# Patient Record
Sex: Female | Born: 1965 | Hispanic: Yes | Smoking: Never smoker
Health system: Southern US, Community
[De-identification: ages and names within clinical notes are randomized; demographics above are authoritative.]

## PROBLEM LIST (undated history)

## (undated) DIAGNOSIS — K219 Gastro-esophageal reflux disease without esophagitis: Secondary | ICD-10-CM

## (undated) DIAGNOSIS — E785 Hyperlipidemia, unspecified: Secondary | ICD-10-CM

## (undated) DIAGNOSIS — I8390 Asymptomatic varicose veins of unspecified lower extremity: Secondary | ICD-10-CM

## (undated) DIAGNOSIS — Z87442 Personal history of urinary calculi: Secondary | ICD-10-CM

## (undated) DIAGNOSIS — Z972 Presence of dental prosthetic device (complete) (partial): Secondary | ICD-10-CM

## (undated) DIAGNOSIS — T753XXA Motion sickness, initial encounter: Secondary | ICD-10-CM

## (undated) DIAGNOSIS — N289 Disorder of kidney and ureter, unspecified: Secondary | ICD-10-CM

## (undated) DIAGNOSIS — K760 Fatty (change of) liver, not elsewhere classified: Secondary | ICD-10-CM

## (undated) DIAGNOSIS — G43909 Migraine, unspecified, not intractable, without status migrainosus: Secondary | ICD-10-CM

## (undated) HISTORY — DX: Hyperlipidemia, unspecified: E78.5

## (undated) HISTORY — PX: ABDOMINAL SURGERY: SHX537

---

## 2020-12-03 ENCOUNTER — Other Ambulatory Visit: Payer: Self-pay

## 2020-12-03 ENCOUNTER — Emergency Department: Payer: Self-pay

## 2020-12-03 ENCOUNTER — Emergency Department
Admission: EM | Admit: 2020-12-03 | Discharge: 2020-12-03 | Disposition: A | Discharge status: Home / Self Care | Payer: Self-pay | Attending: Emergency Medicine | Admitting: Emergency Medicine

## 2020-12-03 DIAGNOSIS — R202 Paresthesia of skin: Secondary | ICD-10-CM | POA: Insufficient documentation

## 2020-12-03 DIAGNOSIS — G43909 Migraine, unspecified, not intractable, without status migrainosus: Secondary | ICD-10-CM

## 2020-12-03 DIAGNOSIS — G43809 Other migraine, not intractable, without status migrainosus: Secondary | ICD-10-CM | POA: Insufficient documentation

## 2020-12-03 DIAGNOSIS — R109 Unspecified abdominal pain: Secondary | ICD-10-CM | POA: Insufficient documentation

## 2020-12-03 LAB — COMPREHENSIVE METABOLIC PANEL
ALT: 16 U/L (ref 0–44)
AST: 19 U/L (ref 15–41)
Albumin: 4.4 g/dL (ref 3.5–5.0)
Alkaline Phosphatase: 83 U/L (ref 38–126)
Anion gap: 9 (ref 5–15)
BUN: 17 mg/dL (ref 6–20)
CO2: 26 mmol/L (ref 22–32)
Calcium: 9.4 mg/dL (ref 8.9–10.3)
Chloride: 106 mmol/L (ref 98–111)
Creatinine, Ser: 0.55 mg/dL (ref 0.44–1.00)
GFR, Estimated: >60 mL/min (ref >60)
Glucose, Bld: 127 mg/dL — ABNORMAL HIGH (ref 70–99)
Potassium: 4.1 mmol/L (ref 3.5–5.1)
Sodium: 141 mmol/L (ref 135–145)
Total Bilirubin: 0.9 mg/dL (ref 0.3–1.2)
Total Protein: 7.4 g/dL (ref 6.5–8.1)

## 2020-12-03 LAB — DIFFERENTIAL
Abs Immature Granulocytes: 0.02 10*3/uL (ref 0.00–0.07)
Basophils Absolute: 0 10*3/uL (ref 0.0–0.1)
Basophils Relative: 1 %
Eosinophils Absolute: 0.1 10*3/uL (ref 0.0–0.5)
Eosinophils Relative: 3 %
Immature Granulocytes: 0 %
Lymphocytes Relative: 37 %
Lymphs Abs: 1.9 10*3/uL (ref 0.7–4.0)
Monocytes Absolute: 0.3 10*3/uL (ref 0.1–1.0)
Monocytes Relative: 6 %
Neutro Abs: 2.8 10*3/uL (ref 1.7–7.7)
Neutrophils Relative %: 53 %

## 2020-12-03 LAB — CBC
HCT: 44.4 % (ref 36.0–46.0)
Hemoglobin: 14.6 g/dL (ref 12.0–15.0)
MCH: 30.4 pg (ref 26.0–34.0)
MCHC: 32.9 g/dL (ref 30.0–36.0)
MCV: 92.5 fL (ref 80.0–100.0)
Platelets: 211 10*3/uL (ref 150–400)
RBC: 4.8 MIL/uL (ref 3.87–5.11)
RDW: 12.7 % (ref 11.5–15.5)
WBC: 5.1 10*3/uL (ref 4.0–10.5)
nRBC: 0 % (ref 0.0–0.2)

## 2020-12-03 LAB — APTT: aPTT: 29 seconds (ref 24–36)

## 2020-12-03 LAB — PROTIME-INR
INR: 1 (ref 0.8–1.2)
Prothrombin Time: 12.4 seconds (ref 11.4–15.2)

## 2020-12-03 MED ORDER — METOCLOPRAMIDE HCL 5 MG/ML IJ SOLN
10.0000 mg | Freq: Once | INTRAMUSCULAR | Status: AC
  Administered 2020-12-03: 10 mg via INTRAVENOUS
  Filled 2020-12-03: qty 2

## 2020-12-03 MED ORDER — KETOROLAC TROMETHAMINE 30 MG/ML IJ SOLN
30.0000 mg | Freq: Once | INTRAMUSCULAR | Status: AC
  Administered 2020-12-03: 30 mg via INTRAVENOUS
  Filled 2020-12-03: qty 1

## 2020-12-03 MED ORDER — BUTALBITAL-APAP-CAFFEINE 50-325-40 MG PO TABS: 1.0000 | ORAL_TABLET | Freq: Four times a day (QID) | ORAL | 0 refills | Status: DC | PRN

## 2020-12-03 MED ORDER — DIPHENHYDRAMINE HCL 50 MG/ML IJ SOLN
50.0000 mg | Freq: Once | INTRAMUSCULAR | Status: AC
  Administered 2020-12-03: 50 mg via INTRAVENOUS
  Filled 2020-12-03: qty 1

## 2020-12-03 MED ORDER — SODIUM CHLORIDE 0.9% FLUSH: 3.0000 mL | Freq: Once | INTRAVENOUS | Status: DC

## 2020-12-03 MED ORDER — SODIUM CHLORIDE 0.9 % IV BOLUS
1000.0000 mL | Freq: Once | INTRAVENOUS | Status: AC
  Administered 2020-12-03: 1000 mL via INTRAVENOUS

## 2020-12-03 NOTE — ED Notes (Signed)
Return from CT scan.  AAOx3  Skin warm and dry. NAD 

## 2020-12-03 NOTE — ED Triage Notes (Addendum)
Pt to ER via POV with complaints of headache that was previously diagnosed a complex migraine x3 months. Pain in head is accompanied with numbness and tingling down the right side into arm and leg/ blurred vision. Pt states "my vision is getting worse" This is on and off, when it stops the right leg is painful and the right arm feels like it is asleep. Has been told this could be sciatica.   Pt also reports a "pit" in her stomach. Pt is tearful in triage. States her husband passed away a month ago.

## 2020-12-03 NOTE — ED Provider Notes (Signed)
[facility] Emergency Department Provider Note  Time seen: 3:23 PM  I have reviewed the triage vital signs and the nursing notes.   HISTORY  Chief Complaint Headache and Abdominal Pain   HPI Brittney Hampton is a 55 y.o. female with no significant past medical history presents to the emergency department for a headache.  According to the patient for the past 3 months she has been experiencing intermittent headache however the past week or 2 she states it has been more constant.  States she will occasionally get tingling sensation in her hands and in her knees and sometimes weakness feeling in bilateral hands and knees.  Patient states that time she feels like her vision is blurred although denies any currently.  Patient does states she recently lost her husband as well as brother.  Denies any fever cough or shortness of breath.  Largely negative review of systems otherwise.   History reviewed. No pertinent past medical history.  There are no problems to display for this patient.   Past Surgical History:  Procedure Laterality Date  . ABDOMINAL SURGERY      Prior to Admission medications   Not on File    Not on File  No family history on file.  Social History Social History   Tobacco Use  . Smoking status: Never Smoker  . Smokeless tobacco: Never Used  Substance Use Topics  . Alcohol use: Never  . Drug use: Never    Review of Systems Constitutional: Negative for fever. Cardiovascular: Negative for chest pain. Respiratory: Negative for shortness of breath. Gastrointestinal: Negative for abdominal pain, vomiting and diarrhea. Musculoskeletal: Negative for musculoskeletal complaints Neurological: Negative for headache All other ROS negative  ____________________________________________   PHYSICAL EXAM:  VITAL SIGNS: ED Triage Vitals  Enc Vitals Group     BP 12/03/20 1307 (!) 141/82     Pulse Rate 12/03/20 1307 60     Resp 12/03/20 1307 16      Temp 12/03/20 1307 98.2 F (36.8 C)     Temp Source 12/03/20 1307 Oral     SpO2 12/03/20 1307 97 %     Weight 12/03/20 1308 161 lb (73 kg)     Height 12/03/20 1308 5\' 5"  (1.651 m)     Head Circumference --      Peak Flow --      Pain Score 12/03/20 1307 9     Pain Loc --      Pain Edu? --      Excl. in [place]? --    Constitutional: Alert and oriented. Well appearing and in no distress. Eyes: Normal exam ENT      Head: Normocephalic and atraumatic.      Mouth/Throat: Mucous membranes are moist. Cardiovascular: Normal rate, regular rhythm.  Respiratory: Normal respiratory effort without tachypnea nor retractions. Breath sounds are clear Gastrointestinal: Soft and nontender. No distention. Musculoskeletal: Nontender with normal range of motion in all extremities. Neurologic:  Normal speech and language. No gross focal neurologic deficits Skin:  Skin is warm, dry and intact.  Psychiatric: Mood and affect are normal.  ____________________________________________    EKG  EKG viewed and interpreted by myself shows a normal sinus rhythm at 6 6 bpm with a narrow QRS, normal axis, normal intervals, no concerning ST changes.  ____________________________________________    RADIOLOGY  IMPRESSION:  No acute intracranial abnormality by noncontrast CT.   ____________________________________________   INITIAL IMPRESSION / ASSESSMENT AND PLAN / ED COURSE  Pertinent labs & imaging  results that were available during my care of the patient were reviewed by me and considered in my medical decision making (see chart for details).   Patient presents emergency department for headache intermittent over the past 3 months although more constant over the past several weeks.  States she will intermittently get blurred vision, intermittently get tingling in her hands and in her knees.  Sometimes she will get pain down the right leg.  Patient does state that her husband as well as her brother  recently passed away.  Depression could be exacerbating the patient's symptoms.  Patient is labs today are largely within normal limits.  EKG and CT scan head are reassuring.  We will treat the patient's headache with fluids Toradol Reglan Benadryl and reassess.  Suspect possible migraine, complicated migraine, depression could be worsening symptoms.  Great neurological exam do not suspect CVA.  Patient's work-up is reassuring.  CT scan is negative.  Patient states her headache is gone after medications.  We will discharge with a prescription for Fioricet as well as neurology follow-up given the duration of the headache.  Patient agreeable to plan of care.  Brittney Hampton was evaluated in Emergency Department on 12/03/2020 for the symptoms described in the history of present illness. She was evaluated in the context of the global COVID-19 pandemic, which necessitated consideration that the patient might be at risk for infection with the SARS-CoV-2 virus that causes COVID-19. Institutional protocols and algorithms that pertain to the evaluation of patients at risk for COVID-19 are in a state of rapid change based on information released by regulatory bodies including the CDC and federal and state organizations. These policies and algorithms were followed during the patient's care in the ED.  ____________________________________________   FINAL CLINICAL IMPRESSION(S) / ED DIAGNOSES  Complicated migraine   Harvest Dark, MD 12/03/20 1658

## 2020-12-06 ENCOUNTER — Ambulatory Visit
Admission: EM | Admit: 2020-12-06 | Discharge: 2020-12-06 | Disposition: A | Discharge status: Home / Self Care | Payer: Self-pay | Attending: Internal Medicine | Admitting: Internal Medicine

## 2020-12-06 ENCOUNTER — Ambulatory Visit: Payer: Self-pay

## 2020-12-06 ENCOUNTER — Ambulatory Visit (INDEPENDENT_AMBULATORY_CARE_PROVIDER_SITE_OTHER): Payer: Self-pay

## 2020-12-06 ENCOUNTER — Encounter: Payer: Self-pay | Admitting: Emergency Medicine

## 2020-12-06 ENCOUNTER — Other Ambulatory Visit: Payer: Self-pay

## 2020-12-06 DIAGNOSIS — S1091XA Abrasion of unspecified part of neck, initial encounter: Secondary | ICD-10-CM

## 2020-12-06 DIAGNOSIS — S2002XA Contusion of left breast, initial encounter: Secondary | ICD-10-CM

## 2020-12-06 MED ORDER — TRAMADOL-ACETAMINOPHEN 37.5-325 MG PO TABS: 1.0000 | ORAL_TABLET | Freq: Four times a day (QID) | ORAL | 0 refills | Status: DC | PRN
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
## 2020-12-06 NOTE — Discharge Instructions (Addendum)
Aplique hielo a las areas dolorosas pr 15 minutes 3-5 veces al dia for 2 dias Vaya a ver a su doctora general en 2-3 dias.

## 2021-03-31 ENCOUNTER — Encounter: Payer: Self-pay | Admitting: Nurse Practitioner

## 2021-03-31 DIAGNOSIS — I700 Atherosclerosis of aorta: Secondary | ICD-10-CM | POA: Insufficient documentation

## 2021-04-01 ENCOUNTER — Other Ambulatory Visit: Payer: Self-pay

## 2021-04-01 ENCOUNTER — Ambulatory Visit (INDEPENDENT_AMBULATORY_CARE_PROVIDER_SITE_OTHER): Payer: Self-pay | Admitting: Nurse Practitioner

## 2021-04-01 ENCOUNTER — Encounter: Payer: Self-pay | Admitting: Nurse Practitioner

## 2021-04-01 VITALS — BP 106/70 | HR 73 | Temp 98.7°F | Wt 157.0 lb

## 2021-04-01 DIAGNOSIS — R7301 Impaired fasting glucose: Secondary | ICD-10-CM | POA: Insufficient documentation

## 2021-04-01 DIAGNOSIS — Z7689 Persons encountering health services in other specified circumstances: Secondary | ICD-10-CM

## 2021-04-01 DIAGNOSIS — R768 Other specified abnormal immunological findings in serum: Secondary | ICD-10-CM

## 2021-04-01 DIAGNOSIS — M2559 Pain in other specified joint: Secondary | ICD-10-CM

## 2021-04-01 DIAGNOSIS — G43709 Chronic migraine without aura, not intractable, without status migrainosus: Secondary | ICD-10-CM | POA: Insufficient documentation

## 2021-04-01 DIAGNOSIS — I700 Atherosclerosis of aorta: Secondary | ICD-10-CM

## 2021-04-01 DIAGNOSIS — M2550 Pain in unspecified joint: Secondary | ICD-10-CM | POA: Insufficient documentation

## 2021-04-01 DIAGNOSIS — Z599 Problem related to housing and economic circumstances, unspecified: Secondary | ICD-10-CM | POA: Insufficient documentation

## 2021-04-01 DIAGNOSIS — Z5989 Other problems related to housing and economic circumstances: Secondary | ICD-10-CM | POA: Insufficient documentation

## 2021-04-01 MED ORDER — GABAPENTIN 100 MG PO CAPS: 100.0000 mg | ORAL_CAPSULE | Freq: Every day | ORAL | 3 refills | Status: DC

## 2021-04-01 MED ORDER — RIZATRIPTAN BENZOATE 5 MG PO TABS: 5.0000 mg | ORAL_TABLET | ORAL | 0 refills | Status: DC | PRN

## 2021-04-01 NOTE — Assessment & Plan Note (Signed)
Ongoing for months to left side, recent imaging reassuring.  Suspect more migraine-related, have recommended she schedule with neurology, but reports she can not afford this.  Will place C4 referral for assist.  At this time trial Maxalt for acute migraine and Gabapentin 100 MG to start at night prior to bed for preventative -- this may offer benefit to arthralgias too.  Labs today.  Return in 2 weeks.

## 2021-04-01 NOTE — Assessment & Plan Note (Signed)
Referral to C4 team for assist, patient would benefit from neurology visit, but can not afford.

## 2021-04-01 NOTE — Assessment & Plan Note (Signed)
Noted on recent labs on review. Check A1c today.

## 2021-04-01 NOTE — Assessment & Plan Note (Signed)
C4 referral placed.

## 2021-04-01 NOTE — Assessment & Plan Note (Signed)
Ongoing to neck, hands, and knees for 3-4 months.  ?OA vs inflammatory disease such as RA or tick borne disease.  Will obtain labs today CBC, CMP, TSH, ANA, Lyme testing, CRP, ESR.  Determine next steps upon return of labs.  Trial Gabapentin 100 MG every night which may benefit migraines and pain.  Continue Tylenol as needed at home.  Return in 2 weeks.  Attempt to assist with getting into specialist if needed.

## 2021-04-01 NOTE — Patient Instructions (Signed)
Migraine Headache A migraine headache is a very strong throbbing pain on one side or both sides of your head. This type of headache can also cause other symptoms. It can last from 4 hours to 3 days. Talk with your doctor about what things may bring on (trigger) this condition. What are the causes? The exact cause of this condition is not known. This condition may be triggered or caused by: Drinking alcohol. Smoking. Taking medicines, such as: Medicine used to treat chest pain (nitroglycerin). Birth control pills. Estrogen. Some blood pressure medicines. Eating or drinking certain products. Doing physical activity. Other things that may trigger a migraine headache include: Having a menstrual period. Pregnancy. Hunger. Stress. Not getting enough sleep or getting too much sleep. Weather changes. Tiredness (fatigue). What increases the risk? Being 25-55 years old. Being female. Having a family history of migraine headaches. Being Caucasian. Having depression or anxiety. Being very overweight. What are the signs or symptoms? A throbbing pain. This pain may: Happen in any area of the head, such as on one side or both sides. Make it hard to do daily activities. Get worse with physical activity. Get worse around bright lights or loud noises. Other symptoms may include: Feeling sick to your stomach (nauseous). Vomiting. Dizziness. Being sensitive to bright lights, loud noises, or smells. Before you get a migraine headache, you may get warning signs (an aura). An aura may include: Seeing flashing lights or having blind spots. Seeing bright spots, halos, or zigzag lines. Having tunnel vision or blurred vision. Having numbness or a tingling feeling. Having trouble talking. Having weak muscles. Some people have symptoms after a migraine headache (postdromal phase), such as: Tiredness. Trouble thinking (concentrating). How is this treated? Taking medicines that: Relieve  pain. Relieve the feeling of being sick to your stomach. Prevent migraine headaches. Treatment may also include: Having acupuncture. Avoiding foods that bring on migraine headaches. Learning ways to control your body functions (biofeedback). Therapy to help you know and deal with negative thoughts (cognitive behavioral therapy). Follow these instructions at home: Medicines Take over-the-counter and prescription medicines only as told by your doctor. Ask your doctor if the medicine prescribed to you: Requires you to avoid driving or using heavy machinery. Can cause trouble pooping (constipation). You may need to take these steps to prevent or treat trouble pooping: Drink enough fluid to keep your pee (urine) pale yellow. Take over-the-counter or prescription medicines. Eat foods that are high in fiber. These include beans, whole grains, and fresh fruits and vegetables. Limit foods that are high in fat and sugar. These include fried or sweet foods. Lifestyle Do not drink alcohol. Do not use any products that contain nicotine or tobacco, such as cigarettes, e-cigarettes, and chewing tobacco. If you need help quitting, ask your doctor. Get at least 8 hours of sleep every night. Limit and deal with stress. General instructions   Keep a journal to find out what may bring on your migraine headaches. For example, write down: What you eat and drink. How much sleep you get. Any change in what you eat or drink. Any change in your medicines. If you have a migraine headache: Avoid things that make your symptoms worse, such as bright lights. It may help to lie down in a dark, quiet room. Do not drive or use heavy machinery. Ask your doctor what activities are safe for you. Keep all follow-up visits as told by your doctor. This is important. Contact a doctor if: You get a migraine headache that   is different or worse than others you have had. You have more than 15 headache days in one  month. Get help right away if: Your migraine headache gets very bad. Your migraine headache lasts longer than 72 hours. You have a fever. You have a stiff neck. You have trouble seeing. Your muscles feel weak or like you cannot control them. You start to lose your balance a lot. You start to have trouble walking. You pass out (faint). You have a seizure. Summary A migraine headache is a very strong throbbing pain on one side or both sides of your head. These headaches can also cause other symptoms. This condition may be treated with medicines and changes to your lifestyle. Keep a journal to find out what may bring on your migraine headaches. Contact a doctor if you get a migraine headache that is different or worse than others you have had. Contact your doctor if you have more than 15 headache days in a month. This information is not intended to replace advice given to you by your health care provider. Make sure you discuss any questions you have with your health care provider. Document Revised: 12/20/2018 Document Reviewed: 10/10/2018 Elsevier Patient Education  2022 [employer]  

## 2021-04-01 NOTE — Assessment & Plan Note (Signed)
Noted on recent imaging, plan on fasting lipid panel in future.  Monitor and if increased risk will recommend ASA and statin.

## 2021-04-01 NOTE — Progress Notes (Signed)
New Patient Office Visit  Subjective:  Patient ID: Brittney Hampton, female    DOB: February 20, 1966  Age: 55 y.o. MRN: [id number]  CC:  Chief Complaint  Patient presents with   Numbness    Patient states she is having issues with nerve pain. Patient states some times she experiencing numbness in her hands and feet. Patient states it feels like pins and needles sensation in her limbs. Patient states she will have pain in neck that radiates down to her right buttock. Patient describes the pain radiates down from her neck feels like a "heaviness sensation." Patient first notice numbness for about 4 months. Patient has OTC Advil.    Joint Swelling    Patient states she is here to discuss the swelling in her hands and she notices it more when she is working. Patient states especially in the morning and she can't make a fist that they are swollen so bad.     HPI Brittney Hampton presents for new patient visit to establish care.  Introduced to Designer, jewellery role and practice setting.  All questions answered.  Discussed provider/patient relationship and expectations.  Spanish speaking only -- interpreter at bedside.    PERSISTENT HEADACHES Seen in ER 12/03/20 and 02/17/21 for ongoing headache complaints. Has some dizziness and nausea -- headaches for 5 months, comes and goes.  She reports medicine provided in ER helped.  She has referral to neurology, but too expensive to see them.  Recent imaging did note aortic atherosclerosis. Duration: months Onset: gradual Severity: 5/10 Quality: dull, aching, and throbbing Frequency: intermittent Location: to right side of head Headache duration: 3 hours to 24 hours or more Radiation: no Time of day headache occurs: varies Alleviating factors: none Aggravating factors:  Headache status at time of visit: asymptomatic Treatments attempted: none   Aura: no Nausea:  yes Vomiting: no Photophobia:  no Phonophobia:  no Effect on social functioning:   yes Numbers of missed days of school/work each month: yes Confusion:  no Gait disturbance/ataxia:  no Behavioral changes:  no Fevers:  no   JOINT PAIN (Neck, knees, hands) Ongoing for 3-4 months, became worse one month ago.  Pain is causing her problems at work, working in Psychologist, sport and exercise in Surveyor, quantity area.  Using hands a lot + standing for long hours.  Does not recall tick bite recently or in past, but does have lots of ticks around where she lives. Status: uncontrolled Treatments attempted: APAP and ibuprofen  Relief with NSAIDs?:  moderate -- at the moment, but after medication wears off pain returns Location: neck, knees, hands Duration:months Severity: 5/10 Quality: no description -- pain in neck -- knee pins and needles Frequency: intermittent -- notices it most when gets up in morning, lasting 3-4 hours, different every morning Radiation: none Aggravating factors: nothing Alleviating factors: NSAIDs and APAP Weakness:  yes in hands Paresthesias / decreased sensation:  yes in hands Fevers:  no   History reviewed. No pertinent past medical history.  Past Surgical History:  Procedure Laterality Date   ABDOMINAL SURGERY     CESAREAN SECTION      Family History  Problem Relation Age of Onset   Osteoarthritis Mother    Diabetes Father    Cancer Brother     Social History   Socioeconomic History   Marital status: Married    Spouse name: Not on file   Number of children: Not on file   Years of education: Not on file   Highest education level: Not on  file  Occupational History   Not on file  Tobacco Use   Smoking status: Never   Smokeless tobacco: Never  Vaping Use   Vaping Use: Never used  Substance and Sexual Activity   Alcohol use: Never   Drug use: Never   Sexual activity: Yes  Other Topics Concern   Not on file  Social History Narrative   ** Merged History Encounter **       Social Determinants of Health   Financial Resource Strain: Medium Risk    Difficulty of Paying Living Expenses: Somewhat hard  Food Insecurity: No Food Insecurity   Worried About Charity fundraiser in the Last Year: Never true   Ran Out of Food in the Last Year: Never true  Transportation Needs: No Transportation Needs   Lack of Transportation (Medical): No   Lack of Transportation (Non-Medical): No  Physical Activity: Insufficiently Active   Days of Exercise per Week: 2 days   Minutes of Exercise per Session: 20 min  Stress: Stress Concern Present   Feeling of Stress : To some extent  Social Connections: Moderately Isolated   Frequency of Communication with Friends and Family: More than three times a week   Frequency of Social Gatherings with Friends and Family: More than three times a week   Attends Religious Services: Never   Marine scientist or Organizations: No   Attends Music therapist: Never   Marital Status: Married  Human resources officer Violence: Not At Risk   Fear of Current or Ex-Partner: No   Emotionally Abused: No   Physically Abused: No   Sexually Abused: No    ROS Review of Systems  Constitutional:  Negative for activity change, appetite change, diaphoresis, fatigue and fever.  Respiratory:  Negative for cough, chest tightness and shortness of breath.   Cardiovascular:  Negative for chest pain, palpitations and leg swelling.  Gastrointestinal: Negative.   Endocrine: Negative for cold intolerance, heat intolerance, polydipsia, polyphagia and polyuria.  Musculoskeletal:  Positive for arthralgias, joint swelling and neck pain. Negative for neck stiffness.  Neurological:  Positive for weakness and headaches. Negative for dizziness, syncope, light-headedness and numbness.  Psychiatric/Behavioral: Negative.     Objective:   Today's Vitals: BP 106/70   Pulse 73   Temp 98.7 F (37.1 C) (Oral)   Wt 157 lb (71.2 kg)   LMP 12/06/2016 (Approximate)   SpO2 98%   BMI 26.13 kg/m   Physical Exam Vitals and nursing note  reviewed.  Constitutional:      General: She is awake. She is not in acute distress.    Appearance: She is well-developed and well-groomed. She is not ill-appearing or toxic-appearing.  HENT:     Head: Normocephalic.     Right Ear: Hearing normal.     Left Ear: Hearing normal.  Eyes:     General: Lids are normal.        Right eye: No discharge.        Left eye: No discharge.     Conjunctiva/sclera: Conjunctivae normal.     Pupils: Pupils are equal, round, and reactive to light.  Neck:     Thyroid: No thyromegaly.     Vascular: No carotid bruit.  Cardiovascular:     Rate and Rhythm: Normal rate and regular rhythm.     Heart sounds: Normal heart sounds. No murmur heard.   No gallop.  Pulmonary:     Effort: Pulmonary effort is normal. No accessory muscle usage or respiratory  distress.     Breath sounds: Normal breath sounds.  Abdominal:     General: Bowel sounds are normal. There is no distension.     Palpations: Abdomen is soft.  Musculoskeletal:     Right hand: No swelling or tenderness. Normal range of motion. Decreased strength of thumb/finger opposition. Normal sensation.     Left hand: No swelling or tenderness. Normal range of motion. Decreased strength of thumb/finger opposition. Normal sensation.     Cervical back: Normal range of motion and neck supple. No rigidity or crepitus. No muscular tenderness. Normal range of motion.     Right knee: Crepitus present. No swelling, erythema, lacerations or bony tenderness. Normal range of motion. Tenderness present over the patellar tendon.     Instability Tests: Anterior drawer test negative. Posterior drawer test negative.     Left knee: Crepitus present. No swelling, erythema, lacerations or bony tenderness. Normal range of motion. Tenderness present over the patellar tendon.     Instability Tests: Anterior drawer test negative. Posterior drawer test negative.     Right lower leg: No edema.     Left lower leg: No edema.   Lymphadenopathy:     Cervical: No cervical adenopathy.  Skin:    General: Skin is warm and dry.  Neurological:     Mental Status: She is alert and oriented to person, place, and time.     Cranial Nerves: Cranial nerves are intact.     Sensory: Sensation is intact.     Motor: Motor function is intact.     Coordination: Coordination is intact.     Gait: Gait is intact.     Deep Tendon Reflexes: Reflexes are normal and symmetric.     Reflex Scores:      Brachioradialis reflexes are 2+ on the right side and 2+ on the left side.      Patellar reflexes are 2+ on the right side and 2+ on the left side. Psychiatric:        Attention and Perception: Attention normal.        Mood and Affect: Mood normal.        Behavior: Behavior is cooperative.        Thought Content: Thought content normal.        Judgment: Judgment normal.    Assessment & Plan:   Problem List Items Addressed This Visit       Cardiovascular and Mediastinum   Aortic atherosclerosis ([place])    Noted on recent imaging, plan on fasting lipid panel in future.  Monitor and if increased risk will recommend ASA and statin.       Chronic migraine without aura without status migrainosus, not intractable    Ongoing for months to left side, recent imaging reassuring.  Suspect more migraine-related, have recommended she schedule with neurology, but reports she can not afford this.  Will place C4 referral for assist.  At this time trial Maxalt for acute migraine and Gabapentin 100 MG to start at night prior to bed for preventative -- this may offer benefit to arthralgias too.  Labs today.  Return in 2 weeks.       Relevant Medications   rizatriptan (MAXALT) 5 MG tablet   gabapentin (NEURONTIN) 100 MG capsule   Other Relevant Orders   Magnesium     Endocrine   IFG (impaired fasting glucose)    Noted on recent labs on review. Check A1c today.       Relevant Orders   HgB  A1c     Other   Uninsured    Referral to C4 team  for assist, patient would benefit from neurology visit, but can not afford.       Joint pain    Ongoing to neck, hands, and knees for 3-4 months.  ?OA vs inflammatory disease such as RA or tick borne disease.  Will obtain labs today CBC, CMP, TSH, ANA, Lyme testing, CRP, ESR.  Determine next steps upon return of labs.  Trial Gabapentin 100 MG every night which may benefit migraines and pain.  Continue Tylenol as needed at home.  Return in 2 weeks.  Attempt to assist with getting into specialist if needed.       Relevant Orders   ANA w/Reflex if Positive   C-reactive protein   Sed Rate (ESR)   Magnesium   B12   CBC with Differential/Platelet   Comprehensive metabolic panel   TSH   Lyme Disease Serology w/Reflex   Financial difficulties    C4 referral placed.       Relevant Orders   AMB Referral to [facility]   Other Visit Diagnoses     Encounter to establish care    -  Primary       Outpatient Encounter Medications as of 04/01/2021  Medication Sig   gabapentin (NEURONTIN) 100 MG capsule Take 1 capsule (100 mg total) by mouth at bedtime.   rizatriptan (MAXALT) 5 MG tablet Take 1 tablet (5 mg total) by mouth as needed for migraine. May repeat in 2 hours if needed   [DISCONTINUED] butalbital-acetaminophen-caffeine (FIORICET) 50-325-40 MG tablet Take 1-2 tablets by mouth every 6 (six) hours as needed for headache. (Patient not taking: Reported on 04/01/2021)   [DISCONTINUED] traMADol-acetaminophen (ULTRACET) 37.5-325 MG tablet Take 1 tablet by mouth every 6 (six) hours as needed. (Patient not taking: Reported on 04/01/2021)   No facility-administered encounter medications on file as of 04/01/2021.    Follow-up: Return in about 2 weeks (around 04/15/2021) for Migraines and joint pain.   Venita Lick, NP

## 2021-04-02 ENCOUNTER — Encounter: Payer: Self-pay | Admitting: Nurse Practitioner

## 2021-04-02 DIAGNOSIS — E538 Deficiency of other specified B group vitamins: Secondary | ICD-10-CM | POA: Insufficient documentation

## 2021-04-04 ENCOUNTER — Telehealth: Payer: Self-pay | Admitting: Nurse Practitioner

## 2021-04-04 ENCOUNTER — Telehealth: Payer: Self-pay | Admitting: *Deleted

## 2021-04-04 DIAGNOSIS — R768 Other specified abnormal immunological findings in serum: Secondary | ICD-10-CM | POA: Insufficient documentation

## 2021-04-04 LAB — COMPREHENSIVE METABOLIC PANEL
ALT: 17 IU/L (ref 0–32)
AST: 18 IU/L (ref 0–40)
Albumin/Globulin Ratio: 1.8 (ref 1.2–2.2)
Albumin: 4.4 g/dL (ref 3.8–4.9)
Alkaline Phosphatase: 95 IU/L (ref 44–121)
BUN/Creatinine Ratio: 18 (ref 9–23)
BUN: 13 mg/dL (ref 6–24)
Bilirubin Total: 0.7 mg/dL (ref 0.0–1.2)
CO2: 24 mmol/L (ref 20–29)
Calcium: 9.8 mg/dL (ref 8.7–10.2)
Chloride: 101 mmol/L (ref 96–106)
Creatinine, Ser: 0.72 mg/dL (ref 0.57–1.00)
Globulin, Total: 2.5 g/dL (ref 1.5–4.5)
Glucose: 81 mg/dL (ref 65–99)
Potassium: 4.9 mmol/L (ref 3.5–5.2)
Sodium: 141 mmol/L (ref 134–144)
Total Protein: 6.9 g/dL (ref 6.0–8.5)
eGFR: 100 mL/min/{1.73_m2} (ref >59)

## 2021-04-04 LAB — MAGNESIUM: Magnesium: 2.2 mg/dL (ref 1.6–2.3)

## 2021-04-04 LAB — CBC WITH DIFFERENTIAL/PLATELET
Basophils Absolute: 0 10*3/uL (ref 0.0–0.2)
Basos: 1 %
EOS (ABSOLUTE): 0.1 10*3/uL (ref 0.0–0.4)
Eos: 3 %
Hematocrit: 45.9 % (ref 34.0–46.6)
Hemoglobin: 14.9 g/dL (ref 11.1–15.9)
Immature Grans (Abs): 0 10*3/uL (ref 0.0–0.1)
Immature Granulocytes: 0 %
Lymphocytes Absolute: 1.9 10*3/uL (ref 0.7–3.1)
Lymphs: 41 %
MCH: 29.7 pg (ref 26.6–33.0)
MCHC: 32.5 g/dL (ref 31.5–35.7)
MCV: 91 fL (ref 79–97)
Monocytes Absolute: 0.4 10*3/uL (ref 0.1–0.9)
Monocytes: 8 %
Neutrophils Absolute: 2.2 10*3/uL (ref 1.4–7.0)
Neutrophils: 47 %
Platelets: 241 10*3/uL (ref 150–450)
RBC: 5.02 x10E6/uL (ref 3.77–5.28)
RDW: 12.6 % (ref 11.7–15.4)
WBC: 4.7 10*3/uL (ref 3.4–10.8)

## 2021-04-04 LAB — ANA W/REFLEX IF POSITIVE
Anti JO-1: <0.2 AI (ref 0.0–0.9)
Anti Nuclear Antibody (ANA): POSITIVE — AB
Centromere Ab Screen: <0.2 AI (ref 0.0–0.9)
Chromatin Ab SerPl-aCnc: <0.2 AI (ref 0.0–0.9)
ENA RNP Ab: 1.3 AI — ABNORMAL HIGH (ref 0.0–0.9)
ENA SM Ab Ser-aCnc: <0.2 AI (ref 0.0–0.9)
ENA SSA (RO) Ab: <0.2 AI (ref 0.0–0.9)
ENA SSB (LA) Ab: <0.2 AI (ref 0.0–0.9)
Scleroderma (Scl-70) (ENA) Antibody, IgG: <0.2 AI (ref 0.0–0.9)
dsDNA Ab: 2 IU/mL (ref 0–9)

## 2021-04-04 LAB — LYME DISEASE SEROLOGY W/REFLEX: Lyme Total Antibody EIA: NEGATIVE

## 2021-04-04 LAB — HEMOGLOBIN A1C
Est. average glucose Bld gHb Est-mCnc: 114 mg/dL
Hgb A1c MFr Bld: 5.6 % (ref 4.8–5.6)

## 2021-04-04 LAB — C-REACTIVE PROTEIN: CRP: 2 mg/L (ref 0–10)

## 2021-04-04 LAB — VITAMIN B12: Vitamin B-12: 142 pg/mL — ABNORMAL LOW (ref 232–1245)

## 2021-04-04 LAB — SEDIMENTATION RATE: Sed Rate: 23 mm/hr (ref 0–40)

## 2021-04-04 LAB — TSH: TSH: 1.92 u[IU]/mL (ref 0.450–4.500)

## 2021-04-04 NOTE — Telephone Encounter (Signed)
Pt called stating that at her last appt she discussed with PCP her feeling of dizziness. She states that she usually feels this in the mornings and was advised that she would receive a medication to help. She is requesting to have PCP look into this for her. Pt is not currently experiencing this on the phone. Please advise.       [facility] [employer], [place] [address] AT [address]  [place] [address]  Phone: [phone] Fax: [phone]  Hours: Not open 24 hours

## 2021-04-04 NOTE — Addendum Note (Signed)
Addended by: Marnee Guarneri T on: 04/04/2021 05:08 PM   Modules accepted: Orders

## 2021-04-04 NOTE — Progress Notes (Signed)
Good afternoon, patient is Spanish speaking only.  Please let her know labs have returned: - B12 level is very low.  I want her to start taking Vitamin B12 1000 MCG daily which she can get over the counter -- this is good for nervous system health and nerve related pain.   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  This is connected with mixed connective tissue disease, systemic lupus, or different muscle inflammation disorders.  She will need to see rheumatology and I will place this referral as this is their specialty and she will need further work-up on how to best and appropriately help her current pain.   - All other labs are stable and normal.  She should hear from rheumatology to schedule over next week, if she does not let us know right away. Keep being awesome!!  Thank you for allowing me to participate in your care.  I appreciate you. Kindest regards, Yichen Gilardi

## 2021-04-04 NOTE — Addendum Note (Signed)
Addended by: Marnee Guarneri T on: 04/04/2021 01:07 PM   Modules accepted: Orders

## 2021-04-05 ENCOUNTER — Telehealth: Payer: Self-pay

## 2021-04-05 ENCOUNTER — Telehealth: Payer: Self-pay | Admitting: *Deleted

## 2021-04-05 LAB — SPECIMEN STATUS REPORT

## 2021-04-05 LAB — URIC ACID: Uric Acid: 3.9 mg/dL (ref 3.0–7.2)

## 2021-04-05 MED ORDER — MECLIZINE HCL 12.5 MG PO TABS: 12.5000 mg | ORAL_TABLET | Freq: Three times a day (TID) | ORAL | 0 refills | Status: DC | PRN

## 2021-04-05 NOTE — Addendum Note (Signed)
Addended by: Marnee Guarneri T on: 04/05/2021 08:46 AM   Modules accepted: Orders

## 2021-04-05 NOTE — Telephone Encounter (Signed)
Attempted to call via Interpreter line, no answer left VM to call office back. OK for PEC to give patient results if calls back.

## 2021-04-05 NOTE — Telephone Encounter (Signed)
   Telephone encounter was:  Unsuccessful.  04/05/2021 Name: Mikali Willhoite MRN: [id number] DOB: 1966/08/28  Unsuccessful outbound call made today to assist with:   insurance   Outreach Attempt:  2nd Attempt  A HIPAA compliant voice message was left requesting a return call.  Instructed patient to call back at   Instructed patient to call back at [phone]  at their earliest convenience. . [place], Care Management  [phone] [address] , [place] [address] Email : [email or web address]

## 2021-04-05 NOTE — Telephone Encounter (Signed)
Copied from [place] [id number]. Topic: General - Other >> Apr 04, 2021  3:45 PM Celene Kras wrote: Reason for CRM: Pt called stating that she is needing to have her lab results and is requesting to have someone give her a call back to discuss. Please advise.

## 2021-04-05 NOTE — Telephone Encounter (Signed)
Please see other telephone encounter and lab result.

## 2021-04-07 ENCOUNTER — Telehealth: Payer: Self-pay | Admitting: *Deleted

## 2021-04-07 NOTE — Telephone Encounter (Signed)
   Telephone encounter was:  Unsuccessful.  04/07/2021 Name: Brittney Hampton MRN: [id number] DOB: September 29, 1965  Unsuccessful outbound call made today to assist with:  Transportation Needs  and Food Insecurity  Outreach Attempt:  2nd Attempt  [place], Care Management  [phone] [address] , [place] [address] Email : [email or web address]

## 2021-04-11 ENCOUNTER — Telehealth: Payer: Self-pay | Admitting: *Deleted

## 2021-04-11 NOTE — Telephone Encounter (Signed)
   Telephone encounter was:  Unsuccessful.  04/11/2021 Name: Brittney Hampton MRN: [id number] DOB: 06/09/1966  Unsuccessful outbound call made today to assist with:  Patient does not respond to voicemail's left from pacific interpreter   Outreach Attempt:  3rd Attempt.  Referral closed unable to contact patient.  A HIPAA compliant voice message was left requesting a return call.  Instructed patient to call back at   Instructed patient to call back at [phone]  at their earliest convenience. . [place], Care Management  [phone] [address] , [place] [address] Email : [email or web address]

## 2021-04-14 ENCOUNTER — Other Ambulatory Visit: Payer: Self-pay

## 2021-04-14 ENCOUNTER — Ambulatory Visit (INDEPENDENT_AMBULATORY_CARE_PROVIDER_SITE_OTHER): Payer: Self-pay | Admitting: Nurse Practitioner

## 2021-04-14 ENCOUNTER — Encounter: Payer: Self-pay | Admitting: Nurse Practitioner

## 2021-04-14 ENCOUNTER — Telehealth: Payer: Self-pay | Admitting: Nurse Practitioner

## 2021-04-14 VITALS — BP 105/63 | HR 61 | Temp 98.2°F | Wt 167.6 lb

## 2021-04-14 DIAGNOSIS — K769 Liver disease, unspecified: Secondary | ICD-10-CM

## 2021-04-14 DIAGNOSIS — G43709 Chronic migraine without aura, not intractable, without status migrainosus: Secondary | ICD-10-CM

## 2021-04-14 DIAGNOSIS — E538 Deficiency of other specified B group vitamins: Secondary | ICD-10-CM

## 2021-04-14 DIAGNOSIS — M2559 Pain in other specified joint: Secondary | ICD-10-CM

## 2021-04-14 MED ORDER — AMITRIPTYLINE HCL 25 MG PO TABS: 25.0000 mg | ORAL_TABLET | Freq: Every day | ORAL | 1 refills | Status: DC

## 2021-04-14 NOTE — Telephone Encounter (Deleted)
Pts son is calling bc pt urology and

## 2021-04-14 NOTE — Assessment & Plan Note (Signed)
Discussed her lab results from 2  Weeks ago. Vitamin B12 142 on 04/01/21. She will start taking vitamin B12 supplement OTC daily. This may be causing some of the numbness and tingling in her arms and legs.

## 2021-04-14 NOTE — Assessment & Plan Note (Signed)
Gabapentin and maxalt not helping headaches. Will start amitriptyline at bedtime. This can help prevent headaches and help her sleep. Discussed possible side effects, call immediately if develop SI/HI. Follow-up in 4 weeks.

## 2021-04-14 NOTE — Assessment & Plan Note (Signed)
ANA and ENA RNP antibody positive on blood work. Rheumatology referral placed last week and is in progress. She can take ibuprofen or tylenol to help with pain. Will await recommendations from rheumatology. Note given for work that allows her to take an extra break in the morning and afternoon. Follow up in 4 weeks.

## 2021-04-14 NOTE — Patient Instructions (Addendum)
Start vitamin B12 over the counter 1,05mg daily  DR. PQuintin Altoat [facility] HospitalRheumatology:  [facility]1[place] [phone]

## 2021-04-14 NOTE — Progress Notes (Signed)
Established Patient Office Visit  Subjective:  Patient ID: Brittney Hampton, female    DOB: 1966/06/30  Age: 55 y.o. MRN: [id number]  CC:  Chief Complaint  Patient presents with   Migraine    2 week f/up- pt states that the medication is not really helping her. States the pain is still coming and going.    Pain    Pt states she doesn't know if she can return to work full duty, wants to discuss going back with restrictions     HPI Coda Filler presents for follow up on migraines and joint pain.  MIGRAINES  She was started on maxalt and gabapentin last visit to help with migraines. She states that neither of the medications helped at all. She is still having a daily headache. She only takes ibuprofen once in a while because tylenol and ibuprofen upset her stomach. She says that the headaches and joint pain cause her to have trouble sleeping. The headaches tend to be on the right side of her head, sometimes in the front on her forehead. The migraines started 2 years ago. She has been under some stress recently, she moved to the [place] 6 months ago and had to get a job.   JOINT PAIN  She notes pain in her hands, back, and knees. She states that her hands are swollen in the mornings and she has a hard time closing her fists. The pain is worse while laying down at night and first thing in the morning. The gabapentin has not helped at all. She has not started taking the vitamin B12 OTC supplement. She is having a hard time at work due to the pain. The first day at work is fine, but then later in the week she is having a hard time working due to pain, which is a numbness and tingling feeling in her hands and right arm.   History reviewed. No pertinent past medical history.  Past Surgical History:  Procedure Laterality Date   ABDOMINAL SURGERY     CESAREAN SECTION      Family History  Problem Relation Age of Onset   Osteoarthritis Mother    Diabetes Father    Cancer Brother     Social  History   Socioeconomic History   Marital status: Married    Spouse name: Not on file   Number of children: Not on file   Years of education: Not on file   Highest education level: Not on file  Occupational History   Not on file  Tobacco Use   Smoking status: Never   Smokeless tobacco: Never  Vaping Use   Vaping Use: Never used  Substance and Sexual Activity   Alcohol use: Never   Drug use: Never   Sexual activity: Yes  Other Topics Concern   Not on file  Social History Narrative   ** Merged History Encounter **       Social Determinants of Health   Financial Resource Strain: Medium Risk   Difficulty of Paying Living Expenses: Somewhat hard  Food Insecurity: No Food Insecurity   Worried About Charity fundraiser in the Last Year: Never true   [place] in the Last Year: Never true  Transportation Needs: No Transportation Needs   Lack of Transportation (Medical): No   Lack of Transportation (Non-Medical): No  Physical Activity: Insufficiently Active   Days of Exercise per Week: 2 days   Minutes of Exercise per Session: 20 min  Stress: Stress  Concern Present   Feeling of Stress : To some extent  Social Connections: Moderately Isolated   Frequency of Communication with Friends and Family: More than three times a week   Frequency of Social Gatherings with Friends and Family: More than three times a week   Attends Religious Services: Never   Marine scientist or Organizations: No   Attends Music therapist: Never   Marital Status: Married  Human resources officer Violence: Not At Risk   Fear of Current or Ex-Partner: No   Emotionally Abused: No   Physically Abused: No   Sexually Abused: No    Outpatient Medications Prior to Visit  Medication Sig Dispense Refill   cephALEXin (KEFLEX) 500 MG capsule Take 500 mg by mouth in the morning and at bedtime.     gabapentin (NEURONTIN) 100 MG capsule Take 1 capsule (100 mg total) by mouth at bedtime. 90  capsule 3   HYDROcodone-acetaminophen (NORCO/VICODIN) 5-325 MG tablet Take by mouth.     ketorolac (TORADOL) 10 MG tablet Take by mouth.     meclizine (ANTIVERT) 12.5 MG tablet Take 1 tablet (12.5 mg total) by mouth 3 (three) times daily as needed for dizziness. 30 tablet 0   ondansetron (ZOFRAN-ODT) 4 MG disintegrating tablet Take by mouth.     rizatriptan (MAXALT) 5 MG tablet Take 1 tablet (5 mg total) by mouth as needed for migraine. May repeat in 2 hours if needed 10 tablet 0   tamsulosin (FLOMAX) 0.4 MG CAPS capsule Take by mouth.     No facility-administered medications prior to visit.    No Known Allergies  ROS Review of Systems  Constitutional:  Positive for fatigue.  HENT: Negative.    Respiratory: Negative.    Cardiovascular: Negative.   Gastrointestinal: Negative.   Genitourinary: Negative.   Musculoskeletal:  Positive for arthralgias.  Skin: Negative.   Neurological:  Positive for dizziness (intermittent) and headaches (daily).  Psychiatric/Behavioral:  Positive for sleep disturbance.        Stress     Objective:    Physical Exam Vitals and nursing note reviewed.  Constitutional:      General: She is not in acute distress.    Appearance: Normal appearance.  HENT:     Head: Normocephalic and atraumatic.  Eyes:     Conjunctiva/sclera: Conjunctivae normal.  Neck:     Vascular: No carotid bruit.  Cardiovascular:     Rate and Rhythm: Normal rate and regular rhythm.     Pulses: Normal pulses.     Heart sounds: Normal heart sounds.  Pulmonary:     Effort: Pulmonary effort is normal.     Breath sounds: Normal breath sounds.  Musculoskeletal:        General: No swelling or tenderness. Normal range of motion.     Cervical back: Normal range of motion.  Skin:    General: Skin is warm and dry.  Neurological:     General: No focal deficit present.     Mental Status: She is alert and oriented to person, place, and time.  Psychiatric:        Mood and Affect: Mood  normal.        Behavior: Behavior normal.        Thought Content: Thought content normal.        Judgment: Judgment normal.    BP 105/63   Pulse 61   Temp 98.2 F (36.8 C) (Oral)   Wt 167 lb 9.6 oz (76 kg)  LMP 12/06/2016 (Approximate)   SpO2 97%   BMI 27.89 kg/m  Wt Readings from Last 3 Encounters:  04/14/21 167 lb 9.6 oz (76 kg)  04/01/21 157 lb (71.2 kg)  12/06/20 157 lb (71.2 kg)     Health Maintenance Due  Topic Date Due   COVID-19 Vaccine (1) Never done   HIV Screening  Never done   Hepatitis C Screening  Never done   TETANUS/TDAP  Never done   PAP SMEAR-Modifier  Never done   COLONOSCOPY (Pts 45-58yr Insurance coverage will need to be confirmed)  Never done   MAMMOGRAM  Never done   Zoster Vaccines- Shingrix (1 of 2) Never done   INFLUENZA VACCINE  04/11/2021    There are no preventive care reminders to display for this patient.  Lab Results  Component Value Date   TSH 1.920 04/01/2021   Lab Results  Component Value Date   WBC 4.7 04/01/2021   HGB 14.9 04/01/2021   HCT 45.9 04/01/2021   MCV 91 04/01/2021   PLT 241 04/01/2021   Lab Results  Component Value Date   NA 141 04/01/2021   K 4.9 04/01/2021   CO2 24 04/01/2021   GLUCOSE 81 04/01/2021   BUN 13 04/01/2021   CREATININE 0.72 04/01/2021   BILITOT 0.7 04/01/2021   ALKPHOS 95 04/01/2021   AST 18 04/01/2021   ALT 17 04/01/2021   PROT 6.9 04/01/2021   ALBUMIN 4.4 04/01/2021   CALCIUM 9.8 04/01/2021   ANIONGAP 9 12/03/2020   EGFR 100 04/01/2021   No results found for: CHOL No results found for: HDL No results found for: LDLCALC No results found for: TRIG No results found for: CHOLHDL Lab Results  Component Value Date   HGBA1C 5.6 04/01/2021      Assessment & Plan:   Problem List Items Addressed This Visit       Cardiovascular and Mediastinum   Chronic migraine without aura without status migrainosus, not intractable - Primary    Gabapentin and maxalt not helping headaches.  Will start amitriptyline at bedtime. This can help prevent headaches and help her sleep. Discussed possible side effects, call immediately if develop SI/HI. Follow-up in 4 weeks.        Relevant Medications   HYDROcodone-acetaminophen (NORCO/VICODIN) 5-325 MG tablet   ketorolac (TORADOL) 10 MG tablet   amitriptyline (ELAVIL) 25 MG tablet     Other   Joint pain    ANA and ENA RNP antibody positive on blood work. Rheumatology referral placed last week and is in progress. She can take ibuprofen or tylenol to help with pain. Will await recommendations from rheumatology. Note given for work that allows her to take an extra break in the morning and afternoon. Follow up in 4 weeks.        B12 deficiency    Discussed her lab results from 2  Weeks ago. Vitamin B12 142 on 04/01/21. She will start taking vitamin B12 supplement OTC daily. This may be causing some of the numbness and tingling in her arms and legs.         Meds ordered this encounter  Medications   amitriptyline (ELAVIL) 25 MG tablet    Sig: Take 1 tablet (25 mg total) by mouth at bedtime.    Dispense:  30 tablet    Refill:  1     Follow-up: Return in about 4 weeks (around 05/12/2021) for 4-6 weeks for migraines, pain.    LCharyl Dancer NP

## 2021-04-15 ENCOUNTER — Telehealth: Payer: Self-pay | Admitting: Nurse Practitioner

## 2021-04-15 ENCOUNTER — Ambulatory Visit: Payer: Self-pay | Admitting: Family Medicine

## 2021-04-15 ENCOUNTER — Ambulatory Visit: Payer: Self-pay

## 2021-04-15 ENCOUNTER — Ambulatory Visit: Payer: Self-pay | Admitting: Nurse Practitioner

## 2021-04-15 DIAGNOSIS — K769 Liver disease, unspecified: Secondary | ICD-10-CM | POA: Insufficient documentation

## 2021-04-15 NOTE — Telephone Encounter (Signed)
Patient came into office and said that she had appointment with the urologist. The CT scan in the ER showed a liver lesion and recommend follow-up with MRI. MRI order placed. Will reach out to CCM for any financial assistance for patient.

## 2021-04-15 NOTE — Telephone Encounter (Signed)
Pts son calling for an appt for a referral. Pt was offered appt at 3:20p today with Dr.Johnson. Pt stated that she was not able to take the appt today because she felt like she needed to go to the emergency room. Pt states that she feels nauseous and is not able to eat or drink. Pt was offered to speak to a nurse - the call dropped at that time. Agent called the pt back 3 times no answer. Please advise

## 2021-04-15 NOTE — Telephone Encounter (Signed)
Reason for Disposition  [1] MODERATE back pain (e.g., interferes with normal activities) AND [2] present > 3 days  Answer Assessment - Initial Assessment Questions 1. ONSET: "When did the pain begin?"      Yesterday 2. LOCATION: "Where does it hurt?" (upper, mid or lower back)     Upper back 3. SEVERITY: "How bad is the pain?"  (e.g., Scale 1-10; mild, moderate, or severe)   - MILD (1-3): doesn't interfere with normal activities    - MODERATE (4-7): interferes with normal activities or awakens from sleep    - SEVERE (8-10): excruciating pain, unable to do any normal activities      7 4. PATTERN: "Is the pain constant?" (e.g., yes, no; constant, intermittent)      Constant 5. RADIATION: "Does the pain shoot into your legs or elsewhere?"     No 6. CAUSE:  "What do you think is causing the back pain?"      Unsure 7. BACK OVERUSE:  "Any recent lifting of heavy objects, strenuous work or exercise?"     No 8. MEDICATIONS: "What have you taken so far for the pain?" (e.g., nothing, acetaminophen, NSAIDS)     No 9. NEUROLOGIC SYMPTOMS: "Do you have any weakness, numbness, or problems with bowel/bladder control?"     No 10. OTHER SYMPTOMS: "Do you have any other symptoms?" (e.g., fever, abdominal pain, burning with urination, blood in urine)       Decreased appetite 11. PREGNANCY: "Is there any chance you are pregnant?" (e.g., yes, no; LMP)       No  Protocols used: Back Pain-A-AH

## 2021-04-15 NOTE — Telephone Encounter (Signed)
Using Spanish interpreter, Brittney Hampton # [id number], pt. Reports new onset of back pain,started yesterday. Pain is in the upper back. Does not radiate. Pain 7/10. Pain is constant, no known injury. Requests appointment today. Appointment made.

## 2021-04-19 ENCOUNTER — Telehealth: Payer: Self-pay

## 2021-04-19 NOTE — Chronic Care Management (AMB) (Signed)
  Care Management   Note  04/19/2021 Name: Brittney Hampton MRN: [id number] DOB: 18-Jun-1966  Brittney Hampton is a 55 y.o. year old female who is a primary care patient of Cannady, Barbaraann Faster, NP. I reached out to [facility] by phone today in response to a referral sent by Brittney Hampton health plan.    Brittney Hampton was given information about care management services today including:  Care management services include personalized support from designated clinical staff supervised by her physician, including individualized plan of care and coordination with other care providers 24/7 contact phone numbers for assistance for urgent and routine care needs. The patient may stop care management services at any time by phone call to the office staff.  Patient agreed to services and verbal consent obtained.   Follow up plan: Telephone appointment with care management team member scheduled for:04/25/2021  Brittney Hampton, [address], [place], [place] [address] Direct Dial: [phone] [email or web address]: [email or web address]

## 2021-04-25 ENCOUNTER — Telehealth: Payer: Self-pay

## 2021-04-25 ENCOUNTER — Telehealth: Payer: Self-pay | Admitting: Nurse Practitioner

## 2021-04-25 ENCOUNTER — Telehealth: Payer: Self-pay | Admitting: Licensed Clinical Social Worker

## 2021-04-25 NOTE — Telephone Encounter (Signed)
    Clinical Social Work  Care Management   Phone Outreach    04/25/2021 Name: [name] MRN: [id number] DOB: [birth year]  Brittney Hampton is a 55 y.o. year old female who is a primary care patient of Cannady, Barbaraann Faster, NP .   Reason for referral: [employer] .    CCM LCSW reached out to patient today utilizing [employer] [phone] [facility]) by phone to introduce self, assess needs and offer Care Management services and interventions.    Telephone outreach was unsuccessful A HIPPA compliant phone message was left for the patient providing contact information and requesting a return call.   Plan:CCM LCSW will wait for return call. If no return call is received, Will route chart to Care Guide to see if patient would like to reschedule phone appointment   Review of patient status, including review of consultants reports, relevant laboratory and other test results, and collaboration with appropriate care team members and the patient's provider was performed as part of comprehensive patient evaluation and provision of care management services.    Christa See, MSW, [email or web address] Phone [phone] 4:52 PM

## 2021-04-25 NOTE — Telephone Encounter (Signed)
Copied from [place] [phone]. Topic: General - Other >> Apr 25, 2021 12:50 PM Tessa Lerner A wrote: Reason for CRM: Patient's son has made call requesting a note from the patient's PCP excusing them from work   The patient has been unable to go to their job today [id number] and is uncertain of when they'll be able to return  The patient would like to be excused due to joint and mobility pain they're currently experiencing   The patient would like to be contacted to discuss further with a member of clinical staff   Please contact further if needed

## 2021-04-25 NOTE — Addendum Note (Signed)
Addended by: Marnee Guarneri T on: 04/25/2021 12:55 PM   Modules accepted: Orders

## 2021-04-25 NOTE — Telephone Encounter (Signed)
Left detailed message for patient's son to make him aware of Jolene's recommendations. Advised patient's son to give our office a call back if he has any questions regarding his mother.

## 2021-04-25 NOTE — Telephone Encounter (Signed)
See other telephone encounter from today

## 2021-04-25 NOTE — Telephone Encounter (Signed)
Pt is calling to report that her dizziness is getting worse. And stands up all the time for work. 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  Pt also has kidney & Liver. Pt is calling to ask if Jolene can write her out of work for 1 month starting tomorrow. And see what happens. Pt reports that she also has a lot of appts. And the medications make her sleepy during the day when she works. Please call the pt when the note is available [phone]

## 2021-04-27 ENCOUNTER — Other Ambulatory Visit: Payer: Self-pay

## 2021-04-27 ENCOUNTER — Ambulatory Visit
Admission: RE | Admit: 2021-04-27 | Discharge: 2021-04-27 | Disposition: A | Discharge status: Home / Self Care | Payer: Self-pay | Source: Ambulatory Visit | Attending: Nurse Practitioner | Admitting: Nurse Practitioner

## 2021-04-27 DIAGNOSIS — K769 Liver disease, unspecified: Secondary | ICD-10-CM | POA: Insufficient documentation

## 2021-04-27 MED ORDER — GADOBUTROL 1 MMOL/ML IV SOLN
7.5000 mL | Freq: Once | INTRAVENOUS | Status: AC | PRN
  Administered 2021-04-27: 7.5 mL via INTRAVENOUS

## 2021-04-27 NOTE — Telephone Encounter (Signed)
Called number provided by patient son and was informed that it was the wrong number. Attempted to call back a second time and no answer and unable to leave voice message.

## 2021-04-29 NOTE — Progress Notes (Signed)
Good afternoon, patient spanish speaking only. Please let her know imaging has returned and did note what we call a hemangioma to liver -- these are often benign (non cancerous) findings, but I would like to place a referral to GI for further assessment, would she be okay with this?  Please let me know and I will place order.  Any questions? Keep being awesome!!  Thank you for allowing me to participate in your care.  I appreciate you. Kindest regards, Rashon Rezek

## 2021-04-30 ENCOUNTER — Encounter: Payer: Self-pay | Admitting: Nurse Practitioner

## 2021-04-30 DIAGNOSIS — D1803 Hemangioma of intra-abdominal structures: Secondary | ICD-10-CM | POA: Insufficient documentation

## 2021-04-30 DIAGNOSIS — K760 Fatty (change of) liver, not elsewhere classified: Secondary | ICD-10-CM | POA: Insufficient documentation

## 2021-05-04 ENCOUNTER — Ambulatory Visit: Payer: Self-pay | Admitting: Nurse Practitioner

## 2021-05-04 ENCOUNTER — Other Ambulatory Visit: Payer: Self-pay | Admitting: Nurse Practitioner

## 2021-05-04 DIAGNOSIS — K760 Fatty (change of) liver, not elsewhere classified: Secondary | ICD-10-CM

## 2021-05-04 DIAGNOSIS — R768 Other specified abnormal immunological findings in serum: Secondary | ICD-10-CM

## 2021-05-04 DIAGNOSIS — D1803 Hemangioma of intra-abdominal structures: Secondary | ICD-10-CM

## 2021-05-09 ENCOUNTER — Ambulatory Visit: Payer: Self-pay | Admitting: Licensed Clinical Social Worker

## 2021-05-09 ENCOUNTER — Telehealth: Payer: Self-pay

## 2021-05-09 NOTE — Telephone Encounter (Signed)
Copied from [place] [phone]. Topic: General - Other >> May 09, 2021 11:02 AM Alanda Slim E wrote: Reason for CRM: Pt would like a call to go over Ultrasound results / please advise

## 2021-05-10 ENCOUNTER — Encounter: Payer: Self-pay | Admitting: *Deleted

## 2021-05-17 NOTE — Patient Instructions (Signed)
Visit Information   Goals Addressed             This Visit's Progress    Find Help in My Community   On track    Timeframe:  Long-Range Goal Priority:  High Start Date:         05/09/21                    Expected End Date:    07/10/21                   Follow Up Date 05/26/21    Call [facility] [phone]  Follow up on insurance options ( [employer], [employer] Assistance and [place]) Contact office with any questions or concerns Attend all scheduled appts with providers        Patient verbalizes understanding of instructions provided today.  Telephone follow up appointment with care management team member scheduled for:05/26/21  Christa See, MSW, [email or web address] Phone [phone] 5:01 AM

## 2021-05-17 NOTE — Chronic Care Management (AMB) (Signed)
Care Management Clinical Social Work Note  05/17/2021 Name: Brittney Hampton MRN: [id number] DOB: [birth year]  Brittney Hampton is a 55 y.o. year old female who is a primary care patient of Cannady, Barbaraann Faster, NP.  The Care Management team was consulted for assistance with chronic disease management and coordination needs.  Engaged with patient by telephone for initial visit in response to provider referral for social work chronic care management and care coordination services  Consent to Services:  Ms. Brittney Hampton was given information about Care Management services today including:  Care Management services includes personalized support from designated clinical staff supervised by her physician, including individualized plan of care and coordination with other care providers 24/7 contact phone numbers for assistance for urgent and routine care needs. The patient may stop case management services at any time by phone call to the office staff.  Patient agreed to services and consent obtained.   Consent to Services:  The patient was given information about Care Management services, agreed to services, and gave verbal consent prior to initiation of services.  Please see initial visit note for detailed documentation.   Patient agreed to services today and consent obtained.   Assessment: Engaged with patient by phone in response to provider referral for social work care coordination services: [employer]  and [place] and [employer].    Patient is currently experiencing symptoms of  stress which seems to be exacerbated by psychosocial stressors. CCM LCSW discussed various resources to assist in stress management and strengthen support system. See Care Plan below for interventions and patient self-care activities.  Recent life changes or stressors: Limited support, financial strain regarding medical bills, and transportation  Recommendation: Patient may benefit from, and is in  agreement work with LCSW to address care coordination needs and will continue to work with the clinical team to address health care and disease management related needs.   Follow up Plan: Patient would like continued follow-up from CCM LCSW .  per patient's request will follow up in 05/26/21.  Will call office if needed prior to next encounter.  SDOH (Social Determinants of Health) assessments and interventions performed:  SDOH Interventions    Flowsheet Row Most Recent Value  SDOH Interventions   Food Insecurity Interventions Intervention Not Indicated  Housing Interventions Intervention Not Indicated  Transportation Interventions [facility]        Advanced Directives Status: Not addressed in this encounter.  Care Plan  No Known Allergies  Outpatient Encounter Medications as of 05/09/2021  Medication Sig   amitriptyline (ELAVIL) 25 MG tablet Take 1 tablet (25 mg total) by mouth at bedtime.   gabapentin (NEURONTIN) 100 MG capsule Take 1 capsule (100 mg total) by mouth at bedtime.   meclizine (ANTIVERT) 12.5 MG tablet Take 1 tablet (12.5 mg total) by mouth 3 (three) times daily as needed for dizziness.   rizatriptan (MAXALT) 5 MG tablet Take 1 tablet (5 mg total) by mouth as needed for migraine. May repeat in 2 hours if needed   No facility-administered encounter medications on file as of 05/09/2021.    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   Hepatic steatosis 04/30/2021   ANA positive 04/04/2021   B12 deficiency 04/02/2021   Uninsured 04/01/2021   Chronic migraine without aura without status migrainosus, not intractable 04/01/2021   Joint pain 04/01/2021   IFG (impaired fasting glucose) 04/01/2021   Financial difficulties 04/01/2021   Aortic atherosclerosis ([place]) 03/31/2021  Conditions to be addressed/monitored:  Stress ; Financial strain, Limited social support, Transportation, and Mental Health Concerns    Care Plan : General Social Work (Adult)  Updates made by Brittney [place]field, LCSW since 05/17/2021 12:00 AM     Problem: Barriers to Treatment      Goal: Barriers to Treatment Identified and Managed   Start Date: 05/09/2021  This Visit's Progress: On track  Priority: High  Note:   Current barriers:    Financial strain, Limited social support, Transportation, and Mental Health Concerns  Clinical Goals: Over the next 120 days, patient will work with SW, Social worker and therapist to reduce or manage symptoms of agitation, mood instability, and/or stress Clinical Interventions:  CCM LCSW utilized [employer] to assist with translation. Patient is currently uninsured and is experiencing financial strain regarding medical bills. Patient has been unemployed for two months stating it "feels impossible sometimes" CCM LCSW informed patient of CAFA and how to apply for program. Patient provided verbal consent for LCSW to mail application to address on file. Patient was strongly encouraged to contact LCSW with any questions or concerns with completion of application Patient was informed that she could apply for financial assistance with [facility] over phone Patient's minor son provides patient with transportation to appointments. CCM LCSW discussed [facility] program and was provided verbal consent to complete referral  Assessment of needs, barriers , agencies contacted, as well as how impacting Review various resources, discussed options and provided patient information about  Research officer, trade union  and Various insurance options ( [employer] Card, [employer]  and [place]) Solution-Focused Strategies, Active listening / Reflection utilized , Emotional Supportive Provided, Verbalization of feelings encouraged , and Made referral to [employer] 1:1 collaboration with primary care provider regarding development and update of comprehensive plan of care as evidenced by  provider attestation and co-signature Inter-disciplinary care team collaboration (see longitudinal plan of care) Patient Goals/Self-Care Activities: Over the next 120 days Call [employer] [phone]  Follow up on insurance options ( [place], [employer] Assistance and [place]) Contact office with any questions or concerns Attend all scheduled appts with providers        Christa See, MSW, [email or web address] Phone [phone] 4:59 AM

## 2021-05-18 ENCOUNTER — Telehealth: Payer: Self-pay

## 2021-05-18 NOTE — Telephone Encounter (Signed)
Copied from [place] [phone]. Topic: General - Call Back - No Documentation >> May 17, 2021  3:35 PM Erick Blinks wrote: Reason for CRM: Pt is confused, she is going to be seen for hepatitis. Has questions, please advise.   Best contact: [phone]

## 2021-05-18 NOTE — Telephone Encounter (Signed)
Copied from [place] [phone]. Topic: Referral - Status >> May 17, 2021  3:33 PM Erick Blinks wrote: Pt is requesting to have an appt sooner than what is available. She is a requesting a new referral.   Best contact: [phone]   Patient is referring to gastro referral. Appointment with them is not until November. Can referral be sent elsewhere?

## 2021-05-19 NOTE — Telephone Encounter (Signed)
Called and LVM letting patient know that her referral has been sent elsewhere.

## 2021-05-20 ENCOUNTER — Ambulatory Visit (INDEPENDENT_AMBULATORY_CARE_PROVIDER_SITE_OTHER): Payer: Self-pay | Admitting: Nurse Practitioner

## 2021-05-20 ENCOUNTER — Other Ambulatory Visit: Payer: Self-pay

## 2021-05-20 ENCOUNTER — Encounter: Payer: Self-pay | Admitting: Nurse Practitioner

## 2021-05-20 VITALS — BP 107/65 | HR 71 | Temp 98.6°F | Wt 170.0 lb

## 2021-05-20 DIAGNOSIS — G43709 Chronic migraine without aura, not intractable, without status migrainosus: Secondary | ICD-10-CM

## 2021-05-20 DIAGNOSIS — R12 Heartburn: Secondary | ICD-10-CM | POA: Insufficient documentation

## 2021-05-20 DIAGNOSIS — E538 Deficiency of other specified B group vitamins: Secondary | ICD-10-CM

## 2021-05-20 DIAGNOSIS — D1803 Hemangioma of intra-abdominal structures: Secondary | ICD-10-CM

## 2021-05-20 DIAGNOSIS — M2559 Pain in other specified joint: Secondary | ICD-10-CM

## 2021-05-20 DIAGNOSIS — R768 Other specified abnormal immunological findings in serum: Secondary | ICD-10-CM

## 2021-05-20 MED ORDER — AMITRIPTYLINE HCL 25 MG PO TABS: 50.0000 mg | ORAL_TABLET | Freq: Every day | ORAL | 5 refills | Status: DC

## 2021-05-20 MED ORDER — CYANOCOBALAMIN 1000 MCG/ML IJ SOLN
1000.0000 ug | INTRAMUSCULAR | Status: DC
  Administered 2021-05-20 – 2021-10-24 (×5): 1000 ug via INTRAMUSCULAR

## 2021-05-20 NOTE — Progress Notes (Signed)
BP 107/65   Pulse 71   Temp 98.6 F (37 C) (Oral)   Wt 170 lb (77.1 kg)   LMP 12/06/2016 (Approximate)   SpO2 97%   BMI 28.29 kg/m    Subjective:    Patient ID: Brittney Hampton, female    DOB: 15-Jun-1966, 55 y.o.   MRN: [id number]  HPI: Brittney Hampton is a 55 y.o. female  Chief Complaint  Patient presents with   Follow-up    Patient states she is here for Migraines. Patient states the migraines comes and goes constantly. Patient states the medication has not helped. Patient states she is here to discuss results from her Rheumatologist. Patient states she is here to discuss her liver and findings after being referred out to different specialists.    Interpreter at bedside to assist with visit.  MIGRAINES Taking Amitriptyline 25 MG at bedtime, she reports this helps but not 100%, still issues sleeping.  Reports headaches are constant at times.  CT in ER in March was normal.  Has not been taking Maxalt.   She has referral to neurology,  to see them on 07/13/21.   Duration: months Onset: gradual Severity: 6/10 Quality: dull, aching, and throbbing Frequency: intermittent Location: to right side of head Headache duration: 5 hours to 24 hours or more Radiation: no Time of day headache occurs: varies Alleviating factors: none Aggravating factors:  Headache status at time of visit: symptomatic Treatments attempted: none   Aura: no Nausea:  yes Vomiting: no Photophobia:  no Phonophobia:  no Effect on social functioning:  yes Numbers of missed days of school/work each month: yes Confusion:  no Gait disturbance/ataxia:  no Behavioral changes:  no Fevers:  no    JOINT PAIN (Neck, knees, hands) Saw rheumatology for initial visit today due to recent positive ANA, multiple labs drawn today by them.  They did recommend B12 injections.  Ongoing for >4 months.  Using hands a lot + standing for long hours.  Is not able to work anymore.  Was given Meloxicam to trial today.   Status:  uncontrolled Treatments attempted: APAP and ibuprofen, Gabapentin (this made her feel nauseous) Relief with NSAIDs?:  moderate -- at the moment, but after medication wears off pain returns Location: neck, knees, hands Duration:months Severity: 8/10 in morning Quality: aching -- pain in neck -- knee pins and needles Frequency: intermittent -- notices it most when gets up in morning, lasting 3-4 hours, different every morning Radiation: none Aggravating factors: nothing Alleviating factors: NSAIDs and APAP Weakness:  yes in hands Paresthesias / decreased sensation:  yes in hands Fevers:  no   GERD Started with burning in stomach about 2 months ago, now constant.   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  She is scheduled to see GI in November for this. GERD control status: uncontrolled Satisfied with current treatment? yes Heartburn frequency: every day Medication side effects: no  Medication compliance: worse Previous GERD medications: none Antacid use frequency:  TUMS Duration: 2 months Nature: burning in stomach Location: in upper stomach Heartburn duration: all day at times Alleviatiating factors:  nothing Aggravating factors: unknown Dysphagia: no Odynophagia:  no Hematemesis: no Blood in stool: no EGD: no    Relevant past medical, surgical, family and social history reviewed and updated as indicated. Interim medical history since our last visit reviewed. Allergies and medications reviewed and updated.  Review  of Systems  Constitutional:  Negative for activity change, appetite change, diaphoresis, fatigue and fever.  Respiratory:  Negative for cough, chest tightness and shortness of breath.   Cardiovascular:  Negative for chest pain, palpitations and leg swelling.  Gastrointestinal: Negative.   Endocrine: Negative for cold  intolerance, heat intolerance, polydipsia, polyphagia and polyuria.  Musculoskeletal:  Positive for arthralgias and joint swelling.  Neurological:  Positive for headaches. Negative for dizziness, syncope, weakness, light-headedness and numbness.  Psychiatric/Behavioral: Negative.     Per HPI unless specifically indicated above     Objective:    BP 107/65   Pulse 71   Temp 98.6 F (37 C) (Oral)   Wt 170 lb (77.1 kg)   LMP 12/06/2016 (Approximate)   SpO2 97%   BMI 28.29 kg/m   Wt Readings from Last 3 Encounters:  05/20/21 170 lb (77.1 kg)  04/14/21 167 lb 9.6 oz (76 kg)  04/01/21 157 lb (71.2 kg)    Physical Exam Vitals and nursing note reviewed.  Constitutional:      General: She is awake. She is not in acute distress.    Appearance: She is well-developed and well-groomed. She is not ill-appearing or toxic-appearing.  HENT:     Head: Normocephalic.     Right Ear: Hearing normal.     Left Ear: Hearing normal.  Eyes:     General: Lids are normal.        Right eye: No discharge.        Left eye: No discharge.     Conjunctiva/sclera: Conjunctivae normal.     Pupils: Pupils are equal, round, and reactive to light.  Neck:     Thyroid: No thyromegaly.     Vascular: No carotid bruit.  Cardiovascular:     Rate and Rhythm: Normal rate and regular rhythm.     Heart sounds: Normal heart sounds. No murmur heard.   No gallop.  Pulmonary:     Effort: Pulmonary effort is normal. No accessory muscle usage or respiratory distress.     Breath sounds: Normal breath sounds.  Abdominal:     General: Bowel sounds are normal. There is no distension.     Palpations: Abdomen is soft.     Tenderness: There is no abdominal tenderness.  Musculoskeletal:     Right hand: No swelling or tenderness. Normal range of motion. Decreased strength of thumb/finger opposition. Normal sensation.     Left hand: No swelling or tenderness. Normal range of motion. Decreased strength of thumb/finger  opposition. Normal sensation.     Cervical back: Normal range of motion and neck supple. No rigidity or crepitus. No muscular tenderness. Normal range of motion.     Right knee: Crepitus present. No swelling, erythema, lacerations or bony tenderness. Normal range of motion. Tenderness present over the patellar tendon.     Instability Tests: Anterior drawer test negative. Posterior drawer test negative.     Left knee: Crepitus present. No swelling, erythema, lacerations or bony tenderness. Normal range of motion. Tenderness present over the patellar tendon.     Instability Tests: Anterior drawer test negative. Posterior drawer test negative.     Right lower leg: No edema.     Left lower leg: No edema.  Lymphadenopathy:     Cervical: No cervical adenopathy.  Skin:    General: Skin is warm and dry.  Neurological:     Mental Status: She is alert and oriented to person, place, and time.     Cranial Nerves: Cranial nerves  are intact.     Sensory: Sensation is intact.     Motor: Motor function is intact.     Coordination: Coordination is intact.     Gait: Gait is intact.     Deep Tendon Reflexes: Reflexes are normal and symmetric.     Reflex Scores:      Brachioradialis reflexes are 2+ on the right side and 2+ on the left side.      Patellar reflexes are 2+ on the right side and 2+ on the left side. Psychiatric:        Attention and Perception: Attention normal.        Mood and Affect: Mood normal.        Behavior: Behavior is cooperative.        Thought Content: Thought content normal.        Judgment: Judgment normal.   Results for orders placed or performed in visit on 04/01/21  ANA w/Reflex if Positive  Result Value Ref Range   Anti Nuclear Antibody (ANA) Positive (A) Negative   dsDNA Ab 2 0 - 9 IU/mL   ENA RNP Ab 1.3 (H) 0.0 - 0.9 AI   ENA SM Ab Ser-aCnc <0.2 0.0 - 0.9 AI   Scleroderma (Scl-70) (ENA) Antibody, IgG <0.2 0.0 - 0.9 AI   ENA SSA (RO) Ab <0.2 0.0 - 0.9 AI   ENA SSB  (LA) Ab <0.2 0.0 - 0.9 AI   Chromatin Ab SerPl-aCnc <0.2 0.0 - 0.9 AI   Anti JO-1 <0.2 0.0 - 0.9 AI   Centromere Ab Screen <0.2 0.0 - 0.9 AI   See below: Comment   C-reactive protein  Result Value Ref Range   CRP 2 0 - 10 mg/L  Sed Rate (ESR)  Result Value Ref Range   Sed Rate 23 0 - 40 mm/hr  HgB A1c  Result Value Ref Range   Hgb A1c MFr Bld 5.6 4.8 - 5.6 %   Est. average glucose Bld gHb Est-mCnc 114 mg/dL  Magnesium  Result Value Ref Range   Magnesium 2.2 1.6 - 2.3 mg/dL  B12  Result Value Ref Range   Vitamin B-12 142 (L) 232 - 1,245 pg/mL  CBC with Differential/Platelet  Result Value Ref Range   WBC 4.7 3.4 - 10.8 x10E3/uL   RBC 5.02 3.77 - 5.28 x10E6/uL   Hemoglobin 14.9 11.1 - 15.9 g/dL   Hematocrit 45.9 34.0 - 46.6 %   MCV 91 79 - 97 fL   MCH 29.7 26.6 - 33.0 pg   MCHC 32.5 31.5 - 35.7 g/dL   RDW 12.6 11.7 - 15.4 %   Platelets 241 150 - 450 x10E3/uL   Neutrophils 47 Not Estab. %   Lymphs 41 Not Estab. %   Monocytes 8 Not Estab. %   Eos 3 Not Estab. %   Basos 1 Not Estab. %   Neutrophils Absolute 2.2 1.4 - 7.0 x10E3/uL   Lymphocytes Absolute 1.9 0.7 - 3.1 x10E3/uL   Monocytes Absolute 0.4 0.1 - 0.9 x10E3/uL   EOS (ABSOLUTE) 0.1 0.0 - 0.4 x10E3/uL   Basophils Absolute 0.0 0.0 - 0.2 x10E3/uL   Immature Granulocytes 0 Not Estab. %   Immature Grans (Abs) 0.0 0.0 - 0.1 x10E3/uL  Comprehensive metabolic panel  Result Value Ref Range   Glucose 81 65 - 99 mg/dL   BUN 13 6 - 24 mg/dL   Creatinine, Ser 0.72 0.57 - 1.00 mg/dL   eGFR 100 >59 mL/min/1.73   BUN/Creatinine Ratio 18  9 - 23   Sodium 141 134 - 144 mmol/L   Potassium 4.9 3.5 - 5.2 mmol/L   Chloride 101 96 - 106 mmol/L   CO2 24 20 - 29 mmol/L   Calcium 9.8 8.7 - 10.2 mg/dL   Total Protein 6.9 6.0 - 8.5 g/dL   Albumin 4.4 3.8 - 4.9 g/dL   Globulin, Total 2.5 1.5 - 4.5 g/dL   Albumin/Globulin Ratio 1.8 1.2 - 2.2   Bilirubin Total 0.7 0.0 - 1.2 mg/dL   Alkaline Phosphatase 95 44 - 121 IU/L   AST 18 0 - 40  IU/L   ALT 17 0 - 32 IU/L  TSH  Result Value Ref Range   TSH 1.920 0.450 - 4.500 uIU/mL  Lyme Disease Serology w/Reflex  Result Value Ref Range   Lyme Total Antibody EIA Negative Negative  Uric acid  Result Value Ref Range   Uric Acid 3.9 3.0 - 7.2 mg/dL  Specimen status report  Result Value Ref Range   specimen status report Comment       Assessment & Plan:   Problem List Items Addressed This Visit       Cardiovascular and Mediastinum   Chronic migraine without aura without status migrainosus, not intractable - Primary    Chronic, ongoing.  Amitriptyline helping, but not 100%.  At this time will increase dose to 50 MG at night and have recommended she take Maxalt as needed.  Educated her on these, as she had not been taking Maxalt, did not realize she was supposed to or could with the Amitriptyline.  She is scheduled to see neurology on 07/13/21, will benefit their recommendations.  ?whether correlated to her underlying inflammatory issues.  Return in December for follow-up.      Relevant Medications   meloxicam (MOBIC) 7.5 MG tablet   amitriptyline (ELAVIL) 25 MG tablet   Hepatic hemangioma    Noted on recent MRI imaging, discussed at length with her today.  Scheduled to see GI in November for further assessment.        Other   Joint pain    Currently being followed by rheumatology with initial visit today, will continue this collaboration, appreciate their input on this case.      B12 deficiency    Ongoing, as recommended by rheumatology will change to every 30 day injections vs oral treatment.  First B12 injection provided in office today and is aware to return in 30 days and stop oral B12 for now, will return to this in future.  Discussed this may benefit her overall fatigue.  Return in December for labs and visit.      Relevant Medications   cyanocobalamin ((VITAMIN B-12)) injection 1,000 mcg   ANA positive    Currently being followed by rheumatology with initial  visit today, will continue this collaboration, appreciate their input on this case.      Heart burn    Worsening over past months.  At this time recommend she trial OTC Nexium daily, in morning prior to eating.  She is aware to obtain this over the counter.  Provided information in Spanish on foods to avoid and diet changes.  Return in December for follow-up, sooner if worsening.        Follow up plan: Return in about 3 months (around 08/19/2021) for For Joint pain follow-up == B12 injection appt for 30 days.

## 2021-05-20 NOTE — Assessment & Plan Note (Signed)
Currently being followed by rheumatology with initial visit today, will continue this collaboration, appreciate their input on this case.

## 2021-05-20 NOTE — Assessment & Plan Note (Signed)
Chronic, ongoing.  Amitriptyline helping, but not 100%.  At this time will increase dose to 50 MG at night and have recommended she take Maxalt as needed.  Educated her on these, as she had not been taking Maxalt, did not realize she was supposed to or could with the Amitriptyline.  She is scheduled to see neurology on 07/13/21, will benefit their recommendations.  ?whether correlated to her underlying inflammatory issues.  Return in December for follow-up.

## 2021-05-20 NOTE — Assessment & Plan Note (Signed)
Noted on recent MRI imaging, discussed at length with her today.  Scheduled to see GI in November for further assessment.

## 2021-05-20 NOTE — Assessment & Plan Note (Signed)
Ongoing, as recommended by rheumatology will change to every 30 day injections vs oral treatment.  First B12 injection provided in office today and is aware to return in 30 days and stop oral B12 for now, will return to this in future.  Discussed this may benefit her overall fatigue.  Return in December for labs and visit.

## 2021-05-20 NOTE — Patient Instructions (Addendum)
Start taking Nexium over the counter every day in morning before you eat.  [facility]  Address: [address], [place], [place] [address]  Phone: [phone]   Opciones de alimentos para pacientes adultos con enfermedad de reflujo gastroesofgico Food Choices for Gastroesophageal Reflux Disease, Adult Si tiene enfermedad de reflujo gastroesofgico (ERGE), los alimentos que consume y los hbitos de alimentacin son Theatre stage manager. Elegir los alimentos adecuados puede ayudar a [employer]. Piense en consultar a un experto en alimentacin (nutricionista) para que lo ayude a Warehouse manager. Consejos para seguir Photographer las etiquetas de los alimentos Elija alimentos que tengan bajo contenido de grasas saturadas. Los alimentos que pueden ayudar con los sntomas incluyen los siguientes: Alimentos que tienen menos del 5 % de los valores diarios (VD) de grasa. Alimentos que tienen 0 gramos de grasas trans. Al cocinar No frer los alimentos. Cocinar la comida al horno, al vapor, a la plancha o en la parrilla. Estos son mtodos que no necesitan mucha grasa para Audiological scientist. Para agregar sabor, trate de consumir hierbas con bajo contenido de picante y [place]. Planificacin de las comidas  Elegir alimentos saludables con bajo contenido de [place], por ejemplo: Frutas y vegetales. Cereales integrales. Productos lcteos con bajo contenido de [place]. Lysbeth Galas, pescado y aves. Haga comidas pequeas frecuentes en lugar de 3 comidas abundantes al da. Coma lentamente, en un lugar donde est relajado. Evite agacharse o recostarse hasta 2 o 3 horas despus de haber comido. Limite los alimentos con alto contenido graso como las carnes grasas o los alimentos fritos. Limite su ingesta de alimentos grasos, como aceites, [place] y [place]. Evite lo siguiente si el mdico se lo indica: Consumir alimentos que le ocasionen sntomas. Pueden ser  distintos para cada persona. Lleve un registro de los alimentos para identificar aquellos que le causen sntomas. Alcohol. Beber mucha cantidad de lquido con las comidas. Comer 2 o 3 horas antes de acostarse. Estilo de vida Mantenga un peso saludable. Pregntele a su mdico cul es el peso saludable para usted. Si necesita perder peso, hable con su mdico para hacerlo de manera segura. Haga actividad fsica durante, al menos, 30 minutos 5 das por semana o ms, o como se lo haya indicado el mdico. Use ropa suelta. No fume ni consuma ningn producto que contenga nicotina o tabaco. Si necesita ayuda para dejar de fumar, consulte al mdico. Duerma con la cabecera de la cama ms elevada que los pies. Use una cua debajo del colchn o bloques debajo del armazn de la cama para Theatre manager la cabecera de la cama elevada. Mastique chicle sin azcar despus de las comidas. Qu alimentos debo comer? Siga una dieta saludable y bien equilibrada que incluya abundantes frutas, verduras, cereales integrales, productos lcteos descremados, carnes magras, pescado y aves. Cada persona es diferente. Los alimentos que pueden causar sntomas en una persona pueden no causar sntomas en otra persona. Trabaje con el mdico para hallar alimentos que sean seguros para usted. Es posible que los productos que se enumeran ms [place] no constituyan una lista completa de lo que puede comer y Electronics engineer. Pngase en contacto con un experto en alimentos para conocer ms opciones. Qu alimentos debo evitar? Limitar algunos de estos alimentos puede ayudar a Chief Technology Officer los sntomas de [place]. Cada persona es diferente. Consulte a un experto en alimentacin o a su mdico para que lo ayude a Pension scheme manager los alimentos exactos que debe evitar, si los hubiere. Frutas Cualquier fruta  que est preparada con grasa agregada. Cualquier fruta que le ocasione sntomas. Para algunas personas, estas pueden incluir, las frutas ctricas como naranja, pomelo,  pia y limn. Verduras Verduras fritas en abundante aceite. Papas fritas. Cualquier verdura que est preparada con grasa agregada. Cualquier verdura que le ocasione sntomas. Para algunas personas, estas pueden incluir tomates y productos con tomate, [place], cebollas y [place], y rbanos picantes. Granos Pasteles o panes sin levadura con grasa agregada. Carnes y otras protenas Carnes de alto contenido graso como carne grasa de vaca o cerdo, salchichas, costillas, jamn, salchicha, salame y tocino. Carnes o protenas fritas, lo que incluye pescado frito y pollo frito. Frutos secos y Engineer, mining de frutos secos, en grandes cantidades. Lcteos Leche entera y [place] con chocolate. Phoebe Sharps. Crema. Helado. Queso crema. Batidos con [employer]. Grasas y [employer]. Margarina. Lardo. [place] clarificada. Bebidas Caf y t negro, con o sin cafena. Bebidas con gas. Refrescos. Bebidas energizantes. Jugo de fruta hecho con frutas cidas, como naranja o pomelo. Jugo de tomate. Bebidas alcohlicas. Dulces y postres Chocolate y cacao. Rosquillas. Alios y condimentos Pimienta. Menta y mentol. Sal agregada. Cualquier condimento, hierbas o aderezos que le ocasionen sntomas. Para algunas personas, esto puede incluir curry, salsa picante o aderezos para ensalada a base de vinagre. Es posible que los productos que se enumeran ms arriba no constituyan una lista completa de lo que no debera comer y Electronics engineer. Pngase en contacto con un experto en alimentos para conocer ms opciones. Preguntas para hacerle al [employer] Los cambios en la dieta y en el estilo de vida a menudo son los primeros pasos que se toman para Air cabin crew los sntomas de [place]. Si los [employer] dieta y el estilo de vida no ayudan, hable con el mdico sobre el uso de medicamentos. Dnde buscar ms informacin Control and instrumentation engineer for Gastrointestinal Disorders (Fundacin Internacional para los Trastornos Gastrointestinales):  aboutgerd.org Resumen Si tiene [employer], las elecciones de alimentos y el [place] de vida son muy importantes para ayudar a Herbalist sntomas. Haga comidas pequeas durante Psychiatrist de 3 comidas abundantes. Coma lentamente y en un lugar donde est relajado. Evite agacharse o recostarse hasta 2 o 3 horas despus de haber comido. Limite los alimentos con alto contenido graso como las carnes grasas o los alimentos fritos. Esta informacin no tiene Marine scientist el consejo del mdico. Asegrese de hacerle al mdico cualquier pregunta que tenga. Document Revised: 04/09/2020 Document Reviewed: 04/09/2020 Elsevier Patient Education  [place].

## 2021-05-20 NOTE — Assessment & Plan Note (Signed)
Worsening over past months.  At this time recommend she trial OTC Nexium daily, in morning prior to eating.  She is aware to obtain this over the counter.  Provided information in Spanish on foods to avoid and diet changes.  Return in December for follow-up, sooner if worsening.

## 2021-05-26 ENCOUNTER — Ambulatory Visit: Payer: Self-pay | Admitting: Licensed Clinical Social Worker

## 2021-05-30 NOTE — Patient Instructions (Signed)
Visit Information   Goals Addressed             This Visit's Progress    Find Help in My Community   On track    Timeframe:  Long-Range Goal Priority:  High Start Date:         05/09/21                    Expected End Date:    07/10/21                   Follow Up Date 06/03/21    Call [facility] [employer] [phone]  Follow up on insurance options ( Orange Card, [employer] Assistance and [place]) Contact office with any questions or concerns Attend all scheduled appts with providers        Patient verbalizes understanding of instructions provided today and agrees to view in [place].   Telephone follow up appointment with care management team member scheduled for:06/03/21  Brittney Hampton, Brittney Hampton, [email or web address] Phone [phone] 10:19 AM

## 2021-05-30 NOTE — Chronic Care Management (AMB) (Signed)
Care Management Clinical Social Work Note  05/30/2021 Name: Brittney Hampton MRN: [id number] DOB: July 08, 1966  Brittney Hampton is a 55 y.o. year old female who is a primary care patient of Cannady, Barbaraann Faster, NP.  The Care Management team was consulted for assistance with chronic disease management and coordination needs.  Engaged with patient by telephone for follow up visit in response to provider referral for social work chronic care management and care coordination services  Consent to Services:  Brittney Hampton was given information about Care Management services today including:  Care Management services includes personalized support from designated clinical staff supervised by her physician, including individualized plan of care and coordination with other care providers 24/7 contact phone numbers for assistance for urgent and routine care needs. The patient may stop case management services at any time by phone call to the office staff.  Patient agreed to services and consent obtained.   Assessment: Review of patient past medical history, allergies, medications, and health status, including review of relevant consultants reports was performed today as part of a comprehensive evaluation and provision of chronic care management and care coordination services.  SDOH (Social Determinants of Health) assessments and interventions performed:    Advanced Directives Status: Not addressed in this encounter.  Care Plan  No Known Allergies  Outpatient Encounter Medications as of 05/26/2021  Medication Sig   amitriptyline (ELAVIL) 25 MG tablet Take 2 tablets (50 mg total) by mouth at bedtime.   meclizine (ANTIVERT) 12.5 MG tablet Take 1 tablet (12.5 mg total) by mouth 3 (three) times daily as needed for dizziness.   meloxicam (MOBIC) 7.5 MG tablet Take by mouth.   rizatriptan (MAXALT) 5 MG tablet Take 1 tablet (5 mg total) by mouth as needed for migraine. May repeat in 2 hours if needed    Facility-Administered Encounter Medications as of 05/26/2021  Medication   cyanocobalamin ((VITAMIN B-12)) injection 1,000 mcg    Patient Active Problem List   Diagnosis Date Noted   Heart burn 05/20/2021   Hepatic hemangioma 04/30/2021   Hepatic steatosis 04/30/2021   ANA positive 04/04/2021   B12 deficiency 04/02/2021   Uninsured 04/01/2021   Chronic migraine without aura without status migrainosus, not intractable 04/01/2021   Joint pain 04/01/2021   IFG (impaired fasting glucose) 04/01/2021   Financial difficulties 04/01/2021   Aortic atherosclerosis ([place]) 03/31/2021    Conditions to be addressed/monitored: Financial constraints related to insurance coverage/financial assistance  Care Plan : General Social Work (Adult)  Updates made by Rebekah Chesterfield, LCSW since 05/30/2021 12:00 AM     Problem: Barriers to Treatment      Goal: Barriers to Treatment Identified and Managed   Start Date: 05/09/2021  This Visit's Progress: On track  Recent Progress: On track  Priority: High  Note:   Current barriers:    Financial strain, Limited social support, Transportation, and Mental Health Concerns  Clinical Goals: Over the next 120 days, patient will work with SW, Social worker and therapist to reduce or manage symptoms of agitation, mood instability, and/or stress Clinical Interventions:  CCM LCSW utilized [employer] to assist with translation ([id number]) Patient is currently uninsured and is experiencing financial strain regarding medical bills. Patient has been unemployed for two months stating it "feels impossible sometimes" CCM LCSW informed patient of CAFA and how to apply for program. Patient provided verbal consent for LCSW to mail application to address on file. Patient was strongly encouraged to contact LCSW with any questions or concerns with completion of  application 09/98: Patient shared she has received the application via mail. CCM LCSW reviewed informed patient  of supportive documentation and answered questions. LCSW will mail letter of support  Patient was informed that she could apply for financial assistance with [facility] over phone Patient's minor son provides patient with transportation to appointments. CCM LCSW discussed [facility] program and was provided verbal consent to complete referral  Assessment of needs, barriers , agencies contacted, as well as how impacting Review various resources, discussed options and provided patient information about  Research officer, trade union  and Various insurance options ( Orange Card, [employer]  and [place]) Solution-Focused Strategies, Active listening / Reflection utilized , Emotional Supportive Provided, Verbalization of feelings encouraged , and Made referral to [employer] 1:1 collaboration with primary care provider regarding development and update of comprehensive plan of care as evidenced by provider attestation and co-signature Inter-disciplinary care team collaboration (see longitudinal plan of care) Patient Goals/Self-Care Activities: Over the next 120 days Call [employer] [phone]  Follow up on insurance options ( [place], [employer] Assistance and [place]) Contact office with any questions or concerns Attend all scheduled appts with providers        Christa See, MSW, [email or web address] Phone [phone] 10:17 AM

## 2021-06-03 ENCOUNTER — Other Ambulatory Visit: Payer: Self-pay

## 2021-06-03 ENCOUNTER — Ambulatory Visit: Payer: Self-pay | Admitting: Licensed Clinical Social Worker

## 2021-06-03 ENCOUNTER — Telehealth: Payer: Self-pay | Admitting: Licensed Clinical Social Worker

## 2021-06-03 ENCOUNTER — Encounter: Payer: Self-pay | Admitting: Nurse Practitioner

## 2021-06-03 NOTE — Telephone Encounter (Signed)
   06/03/2021  [name] 1966/05/30 [id number]   CCM LCSW utilized [employer] to assist with translation [phone]) Patient is currently uninsured and is experiencing financial strain regarding medical bills. Patient has been unemployed for two months stating it "feels impossible sometimes" CCM LCSW inquired about supportive documentation for submission of CAFA application. Patient prefers to meet in person 06/03/21 to review ppwk  Christa See, MSW, [email or web address] Phone [phone] 12:02 PM

## 2021-06-03 NOTE — Chronic Care Management (AMB) (Signed)
Care Management Clinical Social Work Note  06/03/2021 Name: Brittney Hampton MRN: [id number] DOB: 1966-08-26  Brittney Hampton is a 55 y.o. year old female who is a primary care patient of Cannady, Brittney Faster, NP.  The Care Management team was consulted for assistance with chronic disease management and coordination needs.  Engaged with patient face to face for follow up visit in response to provider referral for social work chronic care management and care coordination services  Consent to Services:  Ms. Brittney Hampton was given information about Care Management services today including:  Care Management services includes personalized support from designated clinical staff supervised by her physician, including individualized plan of care and coordination with other care providers 24/7 contact phone numbers for assistance for urgent and routine care needs. The patient may stop case management services at any time by phone call to the office staff.  Patient agreed to services and consent obtained.   Assessment: Review of patient past medical history, allergies, medications, and health status, including review of relevant consultants reports was performed today as part of a comprehensive evaluation and provision of chronic care management and care coordination services.  SDOH (Social Determinants of Health) assessments and interventions performed:    Advanced Directives Status: Not addressed in this encounter.  Care Plan  No Known Allergies  Outpatient Encounter Medications as of 06/03/2021  Medication Sig   amitriptyline (ELAVIL) 25 MG tablet Take 2 tablets (50 mg total) by mouth at bedtime.   meclizine (ANTIVERT) 12.5 MG tablet Take 1 tablet (12.5 mg total) by mouth 3 (three) times daily as needed for dizziness.   meloxicam (MOBIC) 7.5 MG tablet Take by mouth.   rizatriptan (MAXALT) 5 MG tablet Take 1 tablet (5 mg total) by mouth as needed for migraine. May repeat in 2 hours if needed    Facility-Administered Encounter Medications as of 06/03/2021  Medication   cyanocobalamin ((VITAMIN B-12)) injection 1,000 mcg    Patient Active Problem List   Diagnosis Date Noted   Heart burn 05/20/2021   Hepatic hemangioma 04/30/2021   Hepatic steatosis 04/30/2021   ANA positive 04/04/2021   B12 deficiency 04/02/2021   Uninsured 04/01/2021   Chronic migraine without aura without status migrainosus, not intractable 04/01/2021   Joint pain 04/01/2021   IFG (impaired fasting glucose) 04/01/2021   Financial difficulties 04/01/2021   Aortic atherosclerosis ([place]) 03/31/2021    Conditions to be addressed/monitored: ; Financial constraints related to medical coverage  Care Plan : General Social Work (Adult)  Updates made by Brittney Chesterfield, LCSW since 06/03/2021 12:00 AM     Problem: Barriers to Treatment      Goal: Barriers to Treatment Identified and Managed   Start Date: 05/09/2021  This Visit's Progress: On track  Recent Progress: On track  Priority: High  Note:   Current barriers:    Financial strain, Limited social support, Transportation, and Mental Health Concerns  Clinical Goals: Over the next 120 days, patient will work with SW, Social worker and therapist to reduce or manage symptoms of agitation, mood instability, and/or stress Clinical Interventions:  09/22: CCM LCSW utilized [employer] to assist with translation (FB#[id number]) Patient is currently uninsured and is experiencing financial strain regarding medical bills. Patient has been unemployed for two months stating it "feels impossible sometimes" CCM LCSW inquired about supportive documentation for submission of CAFA application. Patient prefers to meet in person 06/03/21 to review ppwk 09/23: CCM LCSW, patient, and son reviewed ppwk and discussed additional ppwk. Letter of Support provided for  patient's cousin to sign and have notarized CCM LCSW informed patient of CAFA and how to apply for program. Patient  provided verbal consent for LCSW to mail application to address on file. Patient was strongly encouraged to contact LCSW with any questions or concerns with completion of application 40/97: Patient shared she has received the application via mail. CCM LCSW reviewed informed patient of supportive documentation and answered questions. LCSW will mail letter of support  Patient was informed that she could apply for financial assistance with [facility] over phone Patient's minor son provides patient with transportation to appointments. CCM LCSW discussed [facility] program and was provided verbal consent to complete referral  Assessment of needs, barriers , agencies contacted, as well as how impacting Review various resources, discussed options and provided patient information about  Research officer, trade union  and Various insurance options ( Orange Card, [employer]  and [place]) Solution-Focused Strategies, Active listening / Reflection utilized , Emotional Supportive Provided, Verbalization of feelings encouraged , and Made referral to [employer] 1:1 collaboration with primary care provider regarding development and update of comprehensive plan of care as evidenced by provider attestation and co-signature Inter-disciplinary care team collaboration (Hampton longitudinal plan of care) Patient Goals/Self-Care Activities: Over the next 120 days Call [employer] [phone]  Follow up on insurance options ( [place], [employer] Assistance and [place]) Contact office with any questions or concerns Attend all scheduled appts with providers       Brittney Hampton, MSW, [email or web address] Phone [phone] 11:56 AM

## 2021-06-03 NOTE — Patient Instructions (Signed)
Visit Information   Goals Addressed             This Visit's Progress    Find Help in My Community   On track    Timeframe:  Long-Range Goal Priority:  High Start Date:         05/09/21                    Expected End Date:    07/10/21                   Follow Up Date 06/08/21    Call [facility] [phone]  Follow up on insurance options ( Orange Card, [employer] Assistance and [place]) Contact office with any questions or concerns Attend all scheduled appts with providers        Patient verbalizes understanding of instructions provided today and agrees to view in [place].   Telephone follow up appointment with care management team member scheduled for:06/08/21  Christa See, MSW, [email or web address] Phone [phone] 11:57 AM

## 2021-06-17 NOTE — Progress Notes (Signed)
Established Patient Office Visit  Subjective:  Patient ID: Brittney Hampton, female    DOB: 04-17-1966  Age: 55 y.o. MRN: [id number]  CC:  Chief Complaint  Patient presents with   skin tags    Has near her eye, and would like to have removed.  Also has some areas of concern on her back.    B12 Injection    HPI Brittney Hampton presents for spot on her back and scheduled B12 injection.  SKIN LESION  Duration: weeks - unsure how long, noticed today when son looked at her back.  Location: back Painful: no Itching: yes Onset:  unsure Context: not changing Associated signs and symptoms:  History of skin cancer: no History of precancerous skin lesions: no Family history of skin cancer: no  SKIN TAG  For the last 2 months, she has noticed an increasing amount of skin tags around her eyes. She usually gets them around neck and has had them removed. Denies pain and itching. She would like to get these skin tags removed as well.   INSOMNIA  Duration: chronic Satisfied with sleep quality: no Difficulty falling asleep: yes - only at times, not every night Difficulty staying asleep: no Waking a few hours after sleep onset: no Early morning awakenings: no Daytime hypersomnolence: no Wakes feeling refreshed: yes Good sleep hygiene: yes Apnea: no Snoring: no Depressed/anxious mood: no Recent stress: no Restless legs/nocturnal leg cramps: no Chronic pain/arthritis: no History of sleep study: no Treatments attempted: melatonin   History reviewed. No pertinent past medical history.  Past Surgical History:  Procedure Laterality Date   ABDOMINAL SURGERY     CESAREAN SECTION      Family History  Problem Relation Age of Onset   Osteoarthritis Mother    Diabetes Father    Cancer Brother     Social History   Socioeconomic History   Marital status: Married    Spouse name: Not on file   Number of children: Not on file   Years of education: Not on file   Highest education  level: Not on file  Occupational History   Not on file  Tobacco Use   Smoking status: Never   Smokeless tobacco: Never  Vaping Use   Vaping Use: Never used  Substance and Sexual Activity   Alcohol use: Never   Drug use: Never   Sexual activity: Yes  Other Topics Concern   Not on file  Social History Narrative   ** Merged History Encounter **       Social Determinants of Health   Financial Resource Strain: Medium Risk   Difficulty of Paying Living Expenses: Somewhat hard  Food Insecurity: No Food Insecurity   Worried About Charity fundraiser in the Last Year: Never true   Arboriculturist in the Last Year: Never true  Transportation Needs: Unmet Transportation Needs   Lack of Transportation (Medical): Yes   Lack of Transportation (Non-Medical): Yes  Physical Activity: Insufficiently Active   Days of Exercise per Week: 2 days   Minutes of Exercise per Session: 20 min  Stress: Stress Concern Present   Feeling of Stress : To some extent  Social Connections: Moderately Isolated   Frequency of Communication with Friends and Family: More than three times a week   Frequency of Social Gatherings with Friends and Family: More than three times a week   Attends Religious Services: Never   Marine scientist or Organizations: No   Attends Archivist  Meetings: Never   Marital Status: Married  Human resources officer Violence: Not At Risk   Fear of Current or Ex-Partner: No   Emotionally Abused: No   Physically Abused: No   Sexually Abused: No    Outpatient Medications Prior to Visit  Medication Sig Dispense Refill   amitriptyline (ELAVIL) 25 MG tablet Take 2 tablets (50 mg total) by mouth at bedtime. 60 tablet 5   meclizine (ANTIVERT) 12.5 MG tablet Take 1 tablet (12.5 mg total) by mouth 3 (three) times daily as needed for dizziness. 30 tablet 0   meloxicam (MOBIC) 7.5 MG tablet Take by mouth.     rizatriptan (MAXALT) 5 MG tablet Take 1 tablet (5 mg total) by mouth as  needed for migraine. May repeat in 2 hours if needed 10 tablet 0   Facility-Administered Medications Prior to Visit  Medication Dose Route Frequency Provider Last Rate Last Admin   cyanocobalamin ((VITAMIN B-12)) injection 1,000 mcg  1,000 mcg Intramuscular Q30 days Cannady, Jolene T, NP   1,000 mcg at 06/20/21 1136    No Known Allergies  ROS Review of Systems  Constitutional:  Positive for fatigue.  Respiratory: Negative.    Cardiovascular: Negative.   Gastrointestinal: Negative.   Genitourinary: Negative.   Musculoskeletal: Negative.   Skin:        Skin tags around eyes and scabbed area to back  Neurological:  Positive for headaches (controlled with amitriptyline).     Objective:    Physical Exam Vitals and nursing note reviewed.  Constitutional:      General: She is not in acute distress.    Appearance: Normal appearance.  HENT:     Head: Normocephalic and atraumatic.  Eyes:     Conjunctiva/sclera: Conjunctivae normal.  Neck:     Vascular: No carotid bruit.  Cardiovascular:     Rate and Rhythm: Normal rate and regular rhythm.     Pulses: Normal pulses.     Heart sounds: Normal heart sounds.  Pulmonary:     Effort: Pulmonary effort is normal.     Breath sounds: Normal breath sounds.  Musculoskeletal:     Cervical back: Normal range of motion.  Skin:    General: Skin is warm and dry.     Comments: 0.5cm round scabbed lesion to middle of her back. Skin tag noted to her back along with several seborrheic keratoses.   Neurological:     General: No focal deficit present.     Mental Status: She is alert and oriented to person, place, and time.  Psychiatric:        Mood and Affect: Mood normal.        Behavior: Behavior normal.        Thought Content: Thought content normal.        Judgment: Judgment normal.    BP 108/75   Pulse 72   Temp 98.4 F (36.9 C) (Oral)   Ht _0  (1.651 m)   Wt 170 lb (77.1 kg)   LMP 12/06/2016 (Approximate)   SpO2 96%   BMI 28.29  kg/m  Wt Readings from Last 3 Encounters:  06/20/21 170 lb (77.1 kg)  05/20/21 170 lb (77.1 kg)  04/14/21 167 lb 9.6 oz (76 kg)     Health Maintenance Due  Topic Date Due   HIV Screening  Never done   Hepatitis C Screening  Never done   TETANUS/TDAP  Never done   PAP SMEAR-Modifier  Never done   COLONOSCOPY (Pts 45-59yr Insurance coverage  will need to be confirmed)  Never done   MAMMOGRAM  Never done   Zoster Vaccines- Shingrix (1 of 2) Never done   COVID-19 Vaccine (4 - Booster for Pfizer series) 04/29/2021    There are no preventive care reminders to display for this patient.  Lab Results  Component Value Date   TSH 1.920 04/01/2021   Lab Results  Component Value Date   WBC 4.7 04/01/2021   HGB 14.9 04/01/2021   HCT 45.9 04/01/2021   MCV 91 04/01/2021   PLT 241 04/01/2021   Lab Results  Component Value Date   NA 141 04/01/2021   K 4.9 04/01/2021   CO2 24 04/01/2021   GLUCOSE 81 04/01/2021   BUN 13 04/01/2021   CREATININE 0.72 04/01/2021   BILITOT 0.7 04/01/2021   ALKPHOS 95 04/01/2021   AST 18 04/01/2021   ALT 17 04/01/2021   PROT 6.9 04/01/2021   ALBUMIN 4.4 04/01/2021   CALCIUM 9.8 04/01/2021   ANIONGAP 9 12/03/2020   EGFR 100 04/01/2021   No results found for: CHOL No results found for: HDL No results found for: LDLCALC No results found for: TRIG No results found for: CHOLHDL Lab Results  Component Value Date   HGBA1C 5.6 04/01/2021      Assessment & Plan:   Problem List Items Addressed This Visit       Cardiovascular and Mediastinum   Chronic migraine without aura without status migrainosus, not intractable    Improved with amitriptyline. Now not having headaches as frequently. Will continue this regimen.         Other   B12 deficiency - Primary    B12 injection given today. Has follow-up scheduled in December with PCP.       Insomnia    Has tried melatonin OTC and has not helped. She also was recently started on amitriptyline  which helped with her migraines, but not her sleep. Will add on hydroxyzine that she can take qHS prn sleep. Follow up in 2 months.       Other Visit Diagnoses     Encounter for screening mammogram for malignant neoplasm of breast       Mammogram ordered and when scheduled in office, [place] stated that she has an abnormal mammogram that wasn't followed up on. U/S ordered   Relevant Orders   MM 3D SCREEN BREAST BILATERAL   Abnormal mammogram       History of abnormal mammogram from [facility]. Will place order for ultrasound and diagnostic mammogram.    Relevant Orders   MM DIAG BREAST TOMO BILATERAL   US BREAST LTD UNI LEFT INC AXILLA   Skin tag       Multiple skin tags around eyes and with multiple seborrheic keratoses, will refer to dermatology for full skin evaluation.        Meds ordered this encounter  Medications   hydrOXYzine (VISTARIL) 25 MG capsule    Sig: Take 1 capsule (25 mg total) by mouth at bedtime as needed for anxiety (sleep).    Dispense:  30 capsule    Refill:  0     Follow-up: Return if symptoms worsen or fail to improve.    Charyl Dancer, NP

## 2021-06-20 ENCOUNTER — Other Ambulatory Visit: Payer: Self-pay

## 2021-06-20 ENCOUNTER — Ambulatory Visit (INDEPENDENT_AMBULATORY_CARE_PROVIDER_SITE_OTHER): Payer: Self-pay | Admitting: Nurse Practitioner

## 2021-06-20 ENCOUNTER — Encounter: Payer: Self-pay | Admitting: Nurse Practitioner

## 2021-06-20 ENCOUNTER — Ambulatory Visit: Payer: Self-pay

## 2021-06-20 VITALS — BP 108/75 | HR 72 | Temp 98.4°F | Ht 65.0 in | Wt 170.0 lb

## 2021-06-20 DIAGNOSIS — Z1231 Encounter for screening mammogram for malignant neoplasm of breast: Secondary | ICD-10-CM

## 2021-06-20 DIAGNOSIS — G43709 Chronic migraine without aura, not intractable, without status migrainosus: Secondary | ICD-10-CM

## 2021-06-20 DIAGNOSIS — G4700 Insomnia, unspecified: Secondary | ICD-10-CM

## 2021-06-20 DIAGNOSIS — Z23 Encounter for immunization: Secondary | ICD-10-CM

## 2021-06-20 DIAGNOSIS — R928 Other abnormal and inconclusive findings on diagnostic imaging of breast: Secondary | ICD-10-CM

## 2021-06-20 DIAGNOSIS — E538 Deficiency of other specified B group vitamins: Secondary | ICD-10-CM

## 2021-06-20 DIAGNOSIS — L918 Other hypertrophic disorders of the skin: Secondary | ICD-10-CM

## 2021-06-20 MED ORDER — HYDROXYZINE PAMOATE 25 MG PO CAPS: 25.0000 mg | ORAL_CAPSULE | Freq: Every evening | ORAL | 0 refills | Status: DC | PRN

## 2021-06-20 NOTE — Assessment & Plan Note (Signed)
B12 injection given today. Has follow-up scheduled in December with PCP.

## 2021-06-20 NOTE — Assessment & Plan Note (Signed)
Has tried melatonin OTC and has not helped. She also was recently started on amitriptyline which helped with her migraines, but not her sleep. Will add on hydroxyzine that she can take qHS prn sleep. Follow up in 2 months.

## 2021-06-20 NOTE — Assessment & Plan Note (Signed)
Improved with amitriptyline. Now not having headaches as frequently. Will continue this regimen.

## 2021-06-21 ENCOUNTER — Other Ambulatory Visit: Payer: Self-pay | Admitting: *Deleted

## 2021-06-21 ENCOUNTER — Inpatient Hospital Stay
Admission: RE | Admit: 2021-06-21 | Discharge: 2021-06-21 | Disposition: A | Discharge status: Home / Self Care | Payer: Self-pay | Source: Ambulatory Visit | Attending: *Deleted | Admitting: *Deleted

## 2021-06-21 DIAGNOSIS — Z1231 Encounter for screening mammogram for malignant neoplasm of breast: Secondary | ICD-10-CM

## 2021-06-23 ENCOUNTER — Ambulatory Visit: Payer: Self-pay | Admitting: Licensed Clinical Social Worker

## 2021-06-23 ENCOUNTER — Other Ambulatory Visit: Payer: Self-pay | Admitting: Nurse Practitioner

## 2021-06-23 DIAGNOSIS — Z1231 Encounter for screening mammogram for malignant neoplasm of breast: Secondary | ICD-10-CM

## 2021-06-23 DIAGNOSIS — R928 Other abnormal and inconclusive findings on diagnostic imaging of breast: Secondary | ICD-10-CM

## 2021-06-23 DIAGNOSIS — N6310 Unspecified lump in the right breast, unspecified quadrant: Secondary | ICD-10-CM

## 2021-06-23 NOTE — Chronic Care Management (AMB) (Signed)
Care Management Clinical Social Work Note  06/23/2021 Name: Brittney Hampton MRN: [id number] DOB: 1965/12/04  Brittney Hampton is a 55 y.o. year old female who is a primary care Brittney Hampton of Cannady, Barbaraann Faster, NP.  The Care Management team was consulted for assistance with chronic disease management and coordination needs.  Engaged with Brittney Hampton by telephone for follow up visit in response to provider referral for social work chronic care management and care coordination services  Consent to Services:  Brittney Hampton was given information about Care Management services today including:  Care Management services includes personalized support from designated clinical staff supervised by her physician, including individualized plan of care and coordination with other care providers 24/7 contact phone numbers for assistance for urgent and routine care needs. The Brittney Hampton may stop case management services at any time by phone call to the office staff.  Brittney Hampton agreed to services and consent obtained.   Consent to Services:  The Brittney Hampton was given information about Care Management services, agreed to services, and gave verbal consent prior to initiation of services.  Please see initial visit note for detailed documentation.   Brittney Hampton agreed to services today and consent obtained.  Engaged with Brittney Hampton by phone in response to provider referral for social work care coordination services:  Assessment/Interventions:  Brittney Hampton continues to maintain positive progress with care plan goals. Brittney Hampton shared that she has obtained Letter of Support and additional supportive documentation for submission of CAFA application. Follow up appt scheduled for 06/24/21 with CCM LCSW to review. See Care Plan below for interventions and Brittney Hampton self-care activities.  Recent life changes or stressors: Obtaining medical coverage  Recommendation: Brittney Hampton may benefit from, and is in agreement work with LCSW to address care coordination  needs and will continue to work with the clinical team to address health care and disease management related needs.   Follow up Plan: Brittney Hampton would like continued follow-up from CCM LCSW .  per Brittney Hampton's request will follow up in 06/24/21.  Will call office if needed prior to next encounter.  SDOH (Social Determinants of Health) assessments and interventions performed:  NA  Advanced Directives Status: Not addressed in this encounter.  Care Plan  No Known Allergies  Outpatient Encounter Medications as of 06/23/2021  Medication Sig   amitriptyline (ELAVIL) 25 MG tablet Take 2 tablets (50 mg total) by mouth at bedtime.   hydrOXYzine (VISTARIL) 25 MG capsule Take 1 capsule (25 mg total) by mouth at bedtime as needed for anxiety (sleep).   meclizine (ANTIVERT) 12.5 MG tablet Take 1 tablet (12.5 mg total) by mouth 3 (three) times daily as needed for dizziness.   meloxicam (MOBIC) 7.5 MG tablet Take by mouth.   rizatriptan (MAXALT) 5 MG tablet Take 1 tablet (5 mg total) by mouth as needed for migraine. May repeat in 2 hours if needed   Facility-Administered Encounter Medications as of 06/23/2021  Medication   cyanocobalamin ((VITAMIN B-12)) injection 1,000 mcg    Brittney Hampton Active Problem List   Diagnosis Date Noted   Insomnia 06/20/2021   Heart burn 05/20/2021   Hepatic hemangioma 04/30/2021   Hepatic steatosis 04/30/2021   ANA positive 04/04/2021   B12 deficiency 04/02/2021   Uninsured 04/01/2021   Chronic migraine without aura without status migrainosus, not intractable 04/01/2021   Joint pain 04/01/2021   IFG (impaired fasting glucose) 04/01/2021   Financial difficulties 04/01/2021   Aortic atherosclerosis ([place]) 03/31/2021    Conditions to be addressed/monitored: Financial constraints related to affording medical coverage  Care  Plan : General Social Work (Adult)  Updates made by Rebekah Chesterfield, LCSW since 06/23/2021 12:00 AM     Problem: Barriers to Treatment      Goal:  Barriers to Treatment Identified and Managed   Start Date: 05/09/2021  This Visit's Progress: On track  Recent Progress: On track  Priority: High  Note:   Current barriers:    Financial strain, Limited social support, Transportation, and Mental Health Concerns  Clinical Goals: Over the next 120 days, Brittney Hampton will work with SW, Social worker and therapist to reduce or manage symptoms of agitation, mood instability, and/or stress Clinical Interventions:  09/22: CCM LCSW utilized [employer] to assist with translation ([id number]) Brittney Hampton is currently uninsured and is experiencing financial strain regarding medical bills. Brittney Hampton has been unemployed for two months stating it "feels impossible sometimes" CCM LCSW inquired about supportive documentation for submission of CAFA application. Brittney Hampton prefers to meet in person 06/03/21 to review ppwk 09/23: CCM LCSW, Brittney Hampton, and Brittney Hampton reviewed ppwk and discussed additional ppwk. Letter of Support provided for Brittney Hampton's cousin to sign and have notarized CCM LCSW informed Brittney Hampton of CAFA and how to apply for program. Brittney Hampton provided verbal consent for LCSW to mail application to address on file. Brittney Hampton was strongly encouraged to contact LCSW with any questions or concerns with completion of application 12/45: Brittney Hampton shared she has received the application via mail. CCM LCSW reviewed informed Brittney Hampton of supportive documentation and answered questions. LCSW will mail letter of support 10/13: Brittney Hampton shared that she has obtained Letter of Support and additional supportive documentation for submission of CAFA application. Follow up appt scheduled for 06/24/21 with CCM LCSW to review Brittney Hampton was informed that she could apply for financial assistance with [facility] over phone Brittney Hampton's minor Brittney Hampton provides Brittney Hampton with transportation to appointments. CCM LCSW discussed [facility] program and was provided verbal consent to complete referral  Assessment of needs,  barriers , agencies contacted, as well as how impacting Review various resources, discussed options and provided Brittney Hampton information about  Research officer, trade union  and Various insurance options ( [employer], [employer]  and [place]) Solution-Focused Strategies, Active listening / Reflection utilized , Emotional Supportive Provided, Verbalization of feelings encouraged , and Made referral to [employer] 1:1 collaboration with primary care provider regarding development and update of comprehensive plan of care as evidenced by provider attestation and co-signature Inter-disciplinary care team collaboration (see longitudinal plan of care) Brittney Hampton Goals/Self-Care Activities: Over the next 120 days Call [employer] [phone]  Follow up on insurance options ( [place], [employer] and [place]) Contact office with any questions or concerns Attend all scheduled appts with providers        Christa See, MSW, [email or web address] Phone [phone] 9:19 AM

## 2021-06-23 NOTE — Patient Instructions (Signed)
Visit Information   Goals Addressed             This Visit's Progress    Find Help in My Community   On track    Timeframe:  Long-Range Goal Priority:  High Start Date:         05/09/21                    Expected End Date:    08/10/21                   Follow Up Date 06/24/21    Call [facility] [phone]  Follow up on insurance options ( Orange Card, [employer] and [place]) Contact office with any questions or concerns Attend all scheduled appts with providers        Patient verbalizes understanding of instructions provided today and agrees to view in [place].   Telephone follow up appointment with care management team member scheduled for:06/24/21  Christa See, MSW, [email or web address] Phone [phone] 9:21 AM

## 2021-06-24 ENCOUNTER — Encounter: Payer: Self-pay | Admitting: Nurse Practitioner

## 2021-06-24 ENCOUNTER — Other Ambulatory Visit: Payer: Self-pay

## 2021-06-24 ENCOUNTER — Ambulatory Visit: Payer: Self-pay | Admitting: Licensed Clinical Social Worker

## 2021-06-27 NOTE — Patient Instructions (Signed)
Visit Information   Goals Addressed             This Visit's Progress    Find Help in My Community   On track    Timeframe:  Long-Range Goal Priority:  High Start Date:         05/09/21                    Expected End Date:    08/10/21                   Follow Up Date 2 weeks from 06/24/21   Call [facility] [phone]  Follow up on insurance options ( Orange [employer], [employer]  and [place]) Contact office with any questions or concerns Attend all scheduled appts with providers        Patient verbalizes understanding of instructions provided today and agrees to view in [place].   The patient has been provided with contact information for the care management team and has been advised to call with any health related questions or concerns.   Christa See, MSW, [email or web address] Phone [phone] 4:32 PM

## 2021-06-27 NOTE — Chronic Care Management (AMB) (Signed)
Care Management Clinical Social Work Note  06/27/2021 Name: Brittney Hampton MRN: [id number] DOB: 09/13/1965  Brittney Hampton Brittney Hampton is a 55 y.o. year old female who is a primary care patient of Cannady, Brittney Faster, NP.  The Care Management team was consulted for assistance with chronic disease management and coordination needs.  Engaged with patient face to face for follow up visit in response to provider referral for social work chronic care management and care coordination services  Consent to Services:  Brittney Hampton was given information about Care Management services today including:  Care Management services includes personalized support from designated clinical staff supervised by her physician, including individualized plan of care and coordination with other care providers 24/7 contact phone numbers for assistance for urgent and routine care needs. The patient may stop case management services at any time by phone call to the office staff.  Patient agreed to services and consent obtained.   Assessment: Review of patient past medical history, allergies, medications, and health status, including review of relevant consultants reports was performed today as part of a comprehensive evaluation and provision of chronic care management and care coordination services.  SDOH (Social Determinants of Health) assessments and interventions performed:  NA  Advanced Directives Status: Not addressed in this encounter.  Care Plan  No Known Allergies  Outpatient Encounter Medications as of 06/24/2021  Medication Sig   amitriptyline (ELAVIL) 25 MG tablet Take 2 tablets (50 mg total) by mouth at bedtime.   hydrOXYzine (VISTARIL) 25 MG capsule Take 1 capsule (25 mg total) by mouth at bedtime as needed for anxiety (sleep).   meclizine (ANTIVERT) 12.5 MG tablet Take 1 tablet (12.5 mg total) by mouth 3 (three) times daily as needed for dizziness.   meloxicam (MOBIC) 7.5 MG tablet Take by mouth.    rizatriptan (MAXALT) 5 MG tablet Take 1 tablet (5 mg total) by mouth as needed for migraine. May repeat in 2 hours if needed   Facility-Administered Encounter Medications as of 06/24/2021  Medication   cyanocobalamin ((VITAMIN B-12)) injection 1,000 mcg    Patient Active Problem List   Diagnosis Date Noted   Insomnia 06/20/2021   Heart burn 05/20/2021   Hepatic hemangioma 04/30/2021   Hepatic steatosis 04/30/2021   ANA positive 04/04/2021   B12 deficiency 04/02/2021   Uninsured 04/01/2021   Chronic migraine without aura without status migrainosus, not intractable 04/01/2021   Joint pain 04/01/2021   IFG (impaired fasting glucose) 04/01/2021   Financial difficulties 04/01/2021   Aortic atherosclerosis ([place]) 03/31/2021    Conditions to be addressed/monitored:  Financial constraints related to medical coverage  Care Plan : General Social Work (Adult)  Updates made by Rebekah Chesterfield, LCSW since 06/27/2021 12:00 AM     Problem: Barriers to Treatment      Goal: Barriers to Treatment Identified and Managed   Start Date: 05/09/2021  This Visit's Progress: On track  Recent Progress: On track  Priority: High  Note:   Current barriers:    Financial strain, Limited social support, Transportation, and Mental Health Concerns  Clinical Goals: Over the next 120 days, patient will work with SW, Social worker and therapist to reduce or manage symptoms of agitation, mood instability, and/or stress Clinical Interventions:  09/22: CCM LCSW utilized [employer] to assist with translation (KP#[id number]) Patient is currently uninsured and is experiencing financial strain regarding medical bills. Patient has been unemployed for two months stating it "feels impossible sometimes" CCM LCSW inquired about supportive documentation for submission of  CAFA application. Patient prefers to meet in person 06/03/21 to review ppwk 09/23: CCM LCSW, patient, and son reviewed ppwk and discussed additional ppwk.  Letter of Support provided for patient's cousin to sign and have notarized 10/14: CCM LCSW met with patient and son, who submitted supportive documentation for CAFA application. CCM LCSW informed patient via voicemail of one additional form that needs to be completed, prior to submitting to [place] LCSW informed patient of CAFA and how to apply for program. Patient provided verbal consent for LCSW to mail application to address on file. Patient was strongly encouraged to contact LCSW with any questions or concerns with completion of application 94/44: Patient shared she has received the application via mail. CCM LCSW reviewed informed patient of supportive documentation and answered questions. LCSW will mail letter of support 10/13: Patient shared that she has obtained Letter of Support and additional supportive documentation for submission of CAFA application. Follow up appt scheduled for 06/24/21 with CCM LCSW to review Patient was informed that she could apply for financial assistance with [facility] over phone Patient's minor son provides patient with transportation to appointments. CCM LCSW discussed [facility] program and was provided verbal consent to complete referral  Assessment of needs, barriers , agencies contacted, as well as how impacting Review various resources, discussed options and provided patient information about  Research officer, trade union  and Various insurance options ( Orange [employer], [employer]  and [place]) Solution-Focused Strategies, Active listening / Reflection utilized , Emotional Supportive Provided, Verbalization of feelings encouraged , and Made referral to [employer] 1:1 collaboration with primary care provider regarding development and update of comprehensive plan of care as evidenced by provider attestation and co-signature Inter-disciplinary care team collaboration (see longitudinal plan of care) Patient Goals/Self-Care  Activities: Over the next 120 days Call [employer] [phone]  Follow up on insurance options ( [place], [employer] and [place]) Contact office with any questions or concerns Attend all scheduled appts with providers        Christa See, MSW, [email or web address] Phone [phone] 4:32 PM

## 2021-07-04 ENCOUNTER — Encounter: Payer: Self-pay | Admitting: Licensed Clinical Social Worker

## 2021-07-04 ENCOUNTER — Other Ambulatory Visit: Payer: Self-pay | Admitting: Nurse Practitioner

## 2021-07-04 ENCOUNTER — Ambulatory Visit: Payer: Self-pay | Admitting: Licensed Clinical Social Worker

## 2021-07-06 NOTE — Chronic Care Management (AMB) (Signed)
Care Management Clinical Social Work Note  07/06/2021 Name: Brittney Hampton MRN: [id number] DOB: 06-28-1966  Brittney Hampton is a 55 y.o. year old female who is a primary care patient of Cannady, Brittney Faster, NP.  The Care Management team was consulted for assistance with chronic disease management and coordination needs.  Engaged with patient face to face for follow up visit in response to provider referral for social work chronic care management and care coordination services  Consent to Services:  Ms. Brittney Hampton was given information about Care Management services today including:  Care Management services includes personalized support from designated clinical staff supervised by her physician, including individualized plan of care and coordination with other care providers 24/7 contact phone numbers for assistance for urgent and routine care needs. The patient may stop case management services at any time by phone call to the office staff.  Patient agreed to services and consent obtained.   Consent to Services:  The patient was given information about Care Management services, agreed to services, and gave verbal consent prior to initiation of services.  Please see initial visit note for detailed documentation.   Patient agreed to services today and consent obtained.  Engaged with patient face to face in response to provider referral for social work care coordination services:  Assessment/Interventions:  Patient continues to maintain positive progress with care plan goals. She completed the financial application and submitted all required documentation. CCM LCSW mailed completed application to [place].   See Care Plan below for interventions and patient self-care activities.  Recent life changes or stressors: Financial Strain  Recommendation: Patient may benefit from, and is in agreement to work with LCSW to address care coordination needs and will continue  to work with the clinical team to address health care and disease management related needs.   Follow up Plan: Patient would like continued follow-up from CCM LCSW .  per patient's request will follow up in 08/15/21.  Will call office if needed prior to next encounter.  SDOH (Social Determinants of Health) assessments and interventions performed:  NA  Advanced Directives Status: Not addressed in this encounter.  Care Plan  No Known Allergies  Outpatient Encounter Medications as of 07/04/2021  Medication Sig   amitriptyline (ELAVIL) 25 MG tablet Take 2 tablets (50 mg total) by mouth at bedtime.   hydrOXYzine (VISTARIL) 25 MG capsule Take 1 capsule (25 mg total) by mouth at bedtime as needed for anxiety (sleep).   meclizine (ANTIVERT) 12.5 MG tablet Take 1 tablet (12.5 mg total) by mouth 3 (three) times daily as needed for dizziness.   meloxicam (MOBIC) 7.5 MG tablet Take by mouth.   rizatriptan (MAXALT) 5 MG tablet Take 1 tablet (5 mg total) by mouth as needed for migraine. May repeat in 2 hours if needed   Facility-Administered Encounter Medications as of 07/04/2021  Medication   cyanocobalamin ((VITAMIN B-12)) injection 1,000 mcg    Patient Active Problem List   Diagnosis Date Noted   Insomnia 06/20/2021   Heart burn 05/20/2021   Hepatic hemangioma 04/30/2021   Hepatic steatosis 04/30/2021   ANA positive 04/04/2021   B12 deficiency 04/02/2021   Uninsured 04/01/2021   Chronic migraine without aura without status migrainosus, not intractable 04/01/2021   Joint pain 04/01/2021   IFG (impaired fasting glucose) 04/01/2021   Financial difficulties 04/01/2021   Aortic atherosclerosis ([place]) 03/31/2021    Conditions to be addressed/monitored: Financial constraints related to medical coverage  Care Plan : General  Social Work (Adult)  Updates made by Rebekah Chesterfield, LCSW since 07/06/2021 12:00 AM     Problem: Barriers to Treatment      Goal: Barriers to Treatment Identified  and Managed   Start Date: 05/09/2021  This Visit's Progress: On track  Recent Progress: On track  Priority: High  Note:   Current barriers:    Financial strain, Limited social support, Transportation, and Mental Health Concerns  Clinical Goals: Over the next 120 days, patient will work with SW, Social worker and therapist to reduce or manage symptoms of agitation, mood instability, and/or stress Clinical Interventions:  09/22: CCM LCSW utilized [employer] to assist with translation ([place]#[id number]) Patient is currently uninsured and is experiencing financial strain regarding medical bills. Patient has been unemployed for two months stating it "feels impossible sometimes" CCM LCSW inquired about supportive documentation for submission of CAFA application. Patient prefers to meet in person 06/03/21 to review ppwk 09/23: CCM LCSW, patient, and son reviewed ppwk and discussed additional ppwk. Letter of Support provided for patient's cousin to sign and have notarized 10/14: CCM LCSW met with patient and son, who submitted supportive documentation for CAFA application. CCM LCSW informed patient via voicemail of one additional form that needs to be completed, prior to submitting to [facility] Financial Department 10/24: Patient completed Financial Application. CCM LCSW submitted the complete application with all of the supportive documentation obtained to [place]: Customer Service CCM LCSW informed patient of CAFA and how to apply for program. Patient provided verbal consent for LCSW to mail application to address on file. Patient was strongly encouraged to contact LCSW with any questions or concerns with completion of application 77/41: Patient shared she has received the application via mail. CCM LCSW reviewed informed patient of supportive documentation and answered questions. LCSW will mail letter of support 10/13: Patient shared that she has obtained Letter of Support and additional supportive  documentation for submission of CAFA application. Follow up appt scheduled for 06/24/21 with CCM LCSW to review Patient was informed that she could apply for financial assistance with [facility] over phone Patient's minor son provides patient with transportation to appointments. CCM LCSW discussed [facility] program and was provided verbal consent to complete referral  Assessment of needs, barriers , agencies contacted, as well as how impacting Review various resources, discussed options and provided patient information about  Research officer, trade union  and Various insurance options ( [employer], [employer]  and [place]) Solution-Focused Strategies, Active listening / Reflection utilized , Emotional Supportive Provided, Verbalization of feelings encouraged , and Made referral to [employer] 1:1 collaboration with primary care provider regarding development and update of comprehensive plan of care as evidenced by provider attestation and co-signature Inter-disciplinary care team collaboration (see longitudinal plan of care) Patient Goals/Self-Care Activities: Over the next 120 days Call [employer] [phone]  Follow up on insurance options ( [place], [employer] and [place]) Contact office with any questions or concerns Attend all scheduled appts with providers        Christa See, MSW, [email or web address] Phone [phone] 7:02 AM

## 2021-07-06 NOTE — Patient Instructions (Signed)
Visit Information   Goals Addressed             This Visit's Progress    Find Help in My Community   On track    Timeframe:  Long-Range Goal Priority:  High Start Date:         05/09/21                    Expected End Date:    09/10/21                   Follow Up Date 08/15/21   Call [facility] [phone]  Follow up on insurance options ( [employer], [employer] and [place]) Contact office with any questions or concerns Attend all scheduled appts with providers        Patient verbalizes understanding of instructions provided today and agrees to view in [place].   Telephone follow up appointment with care management team member scheduled for:08/15/21  Christa See, MSW, [email or web address] Phone [phone] 7:05 AM

## 2021-07-13 ENCOUNTER — Other Ambulatory Visit: Payer: Self-pay

## 2021-07-13 ENCOUNTER — Encounter: Payer: Self-pay | Admitting: Gastroenterology

## 2021-07-13 ENCOUNTER — Ambulatory Visit (INDEPENDENT_AMBULATORY_CARE_PROVIDER_SITE_OTHER): Payer: Self-pay | Admitting: Gastroenterology

## 2021-07-13 VITALS — BP 110/74 | HR 67 | Temp 98.5°F | Ht 65.0 in | Wt 168.4 lb

## 2021-07-13 DIAGNOSIS — K760 Fatty (change of) liver, not elsewhere classified: Secondary | ICD-10-CM

## 2021-07-13 DIAGNOSIS — Z1211 Encounter for screening for malignant neoplasm of colon: Secondary | ICD-10-CM

## 2021-07-13 MED ORDER — PEG 3350-KCL-NA BICARB-NACL 420 G PO SOLR: ORAL | 0 refills | Status: DC

## 2021-07-13 NOTE — Progress Notes (Signed)
Gastroenterology Consultation  Referring Provider:     Venita Lick, NP Primary Care Physician:  Venita Lick, NP Primary Gastroenterologist:  Dr. Allen Norris     Reason for Consultation:     Hepatic steatosis        HPI:   Brittney Hampton is a 55 y.o. y/o female referred for consultation & management of hepatic steatosis by Dr. Ned Card, Barbaraann Faster, NP.  This patient was referred to me after being seen at [facility] for flank pain with a CT scan on 1 August that showed:  Impression  -There is a 0.4 cm obstructing stone at the left vesicoureteral junction with associated mild hydroureteronephrosis of the left renal collecting system.  -Redemonstrated indeterminate heterogeneous right hepatic lobe lesion which measures to 5.6 cm on today's exam. Consider further evaluation with nonemergent outpatient MRI.   ====================  ADDENDUM (04/12/2021 10:06 AM):  On review, the following additional findings were noted   1. There is left mild hydroureteronephrosis, similar to 09/21/2020. There is a calculus in the left posterior bladder adjacent to the ureterovesicular junction. However, it is unusual that this has not resolved since 09/21/2020. Therefore, further evaluation with contrast-enhanced imaging of the urinary bladder is recommended to exclude an underlying urothelial lesion.   2. Indeterminate mixed attenuation, centrally hypodense mass in the right hepatic lobe for which further evaluation with MRI abdomen with and without contrast.    The patient then had a CT scan of the abdomen to evaluate this liver lesion and that showed:  IMPRESSION: 1. 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 2. Diffuse hepatic steatosis.  Because the MRI showed diffuse hepatic steatosis the patient was referred for evaluation to me.  The patient's most recent  lab work showed her to have a normal blood test for her liver enzymes.  There was no elevation of any of her LFTs.  Those liver enzymes were in July and her previous liver enzymes in March of this year were also normal.  The patient's history today is obtained through a [place] interpreter.   Past Surgical History:  Procedure Laterality Date   ABDOMINAL SURGERY     CESAREAN SECTION      Prior to Admission medications   Medication Sig Start Date End Date Taking? Authorizing Provider  amitriptyline (ELAVIL) 25 MG tablet Take 2 tablets (50 mg total) by mouth at bedtime. 05/20/21   Cannady, Henrine Screws T, NP  hydrOXYzine (VISTARIL) 25 MG capsule Take 1 capsule (25 mg total) by mouth at bedtime as needed for anxiety (sleep). 06/20/21   McElwee, Scheryl Darter, NP  meclizine (ANTIVERT) 12.5 MG tablet Take 1 tablet (12.5 mg total) by mouth 3 (three) times daily as needed for dizziness. 04/05/21   Cannady, Henrine Screws T, NP  meloxicam (MOBIC) 7.5 MG tablet Take by mouth. 05/19/21   [provider]  rizatriptan (MAXALT) 5 MG tablet Take 1 tablet (5 mg total) by mouth as needed for migraine. May repeat in 2 hours if needed 04/01/21   Venita Lick, NP    Family History  Problem Relation Age of Onset   Osteoarthritis Mother    Diabetes Father    Cancer Brother      Social History   Tobacco Use   Smoking status: Never   Smokeless tobacco: Never  Vaping Use   Vaping Use: Never used  Substance Use Topics  Alcohol use: Never   Drug use: Never    Allergies as of 07/13/2021   (No Known Allergies)    Review of Systems:    All systems reviewed and negative except where noted in HPI.   Physical Exam:  LMP 12/06/2016 (Approximate)  Patient's last menstrual period was 12/06/2016 (approximate). General:   Alert,  Well-developed, well-nourished, pleasant and cooperative in NAD Head:  Normocephalic and atraumatic. Eyes:  Sclera clear, no icterus.   Conjunctiva pink. Ears:  Normal auditory  acuity. Neck:  Supple; no masses or thyromegaly. Lungs:  Respirations even and unlabored.  Clear throughout to auscultation.   No wheezes, crackles, or rhonchi. No acute distress. Heart:  Regular rate and rhythm; no murmurs, clicks, rubs, or gallops. Abdomen:  Normal bowel sounds.  No bruits.  Soft, non-tender and non-distended without masses, hepatosplenomegaly or hernias noted.  No guarding or rebound tenderness.  Negative Carnett sign.   Rectal:  Deferred.  Pulses:  Normal pulses noted. Extremities:  No clubbing or edema.  No cyanosis. Neurologic:  Alert and oriented x3;  grossly normal neurologically. Skin:  Intact without significant lesions or rashes.  No jaundice. Lymph Nodes:  No significant cervical adenopathy. Psych:  Alert and cooperative. Normal mood and affect.  Imaging Studies: MM Outside Films Mammo  Result Date: 06/21/2021 This examination belongs to an outside facility and is stored here for comparison purposes only.  Contact the originating outside institution for any associated report or interpretation.  MM Outside Films Mammo  Result Date: 06/21/2021 This examination belongs to an outside facility and is stored here for comparison purposes only.  Contact the originating outside institution for any associated report or interpretation.  MM Outside Films Mammo  Result Date: 06/21/2021 This examination belongs to an outside facility and is stored here for comparison purposes only.  Contact the originating outside institution for any associated report or interpretation.   Assessment and Plan:   Brittney Hampton is a 55 y.o. y/o female who comes in today with a finding of fatty liver.  The patient denies having diabetes and has a BMI of 28.  She also does not know if she has had high cholesterol or high triglycerides.  Due to this the patient will have her blood sends off for triglycerides and cholesterol. She reports that she has not eaten anything today.  The patient  has been told about the causes of fatty liver and that her liver enzymes are not elevated at this time.  She has been told about diet and mitigating factors for fatty liver.  The patient has been explained the plan for weight loss and agrees with it.  She will be contacted with the results of her blood work. The patient was explained that the liver lesion is benign and needs no further workup.  Life-style modification to include routine physical activity of at least 20-30 minutes daily as well as dietary modification was discussed. Specifically a Mediterranean style diet which is composed of fish, white meat, fresh fruit, vegetable, whole grains, and legumes was recommended. Foods enriched with excess refined sugars, such as high fructose corn syrup, sweetened beveraages and hydrogenated fats ( i,e. the "Western Diet") should be avoided. A higher protein, lower carbohydrate, lower fat diet is ideal in decreasing excess hydrogenated fats and complex sugars. Coffee consumption has been proven to be beneficial in improving liver injury. Routine physical activity which results in 3-5% weight loss improves insulin resistance and hepatic steatosis. Weight loss of 5-10% may improve nonalcoholic  steatohepatisis or even hepatic fibrosis. Both diet and exercise are the foundation of treatment for patients with NAFLD.     Lucilla Lame, MD. Marval Regal    Note: This dictation was prepared with Dragon dictation along with smaller phrase technology. Any transcriptional errors that result from this process are unintentional.

## 2021-07-14 ENCOUNTER — Encounter: Payer: Self-pay | Admitting: Anesthesiology

## 2021-07-14 ENCOUNTER — Encounter: Payer: Self-pay | Admitting: Gastroenterology

## 2021-07-14 LAB — LIPID PANEL
Chol/HDL Ratio: 3.3 ratio (ref 0.0–4.4)
Cholesterol, Total: 221 mg/dL — ABNORMAL HIGH (ref 100–199)
HDL: 66 mg/dL (ref >39)
LDL Chol Calc (NIH): 137 mg/dL — ABNORMAL HIGH (ref 0–99)
Triglycerides: 103 mg/dL (ref 0–149)
VLDL Cholesterol Cal: 18 mg/dL (ref 5–40)

## 2021-07-15 ENCOUNTER — Inpatient Hospital Stay: Admission: RE | Admit: 2021-07-15 | Payer: Self-pay | Source: Ambulatory Visit

## 2021-07-15 ENCOUNTER — Other Ambulatory Visit: Payer: Self-pay

## 2021-07-18 ENCOUNTER — Telehealth: Payer: Self-pay

## 2021-07-18 NOTE — Telephone Encounter (Signed)
Called patient last week to let her know about her lipid panel results and patient understood. I also asked patient if she had health insurance and she stated that she did. Therefore, I asked her to bring it back to our office so we could make a copy and put it the information in her chart since she will soon have a colonoscopy. Patient was contacted today again to remind her that we needed a copy of her insurance card and she stated that she would be going to the [place] office so the front desk personal makes a copy and put in the system.

## 2021-07-18 NOTE — Telephone Encounter (Signed)
-----   Message from Glennie Isle, Olds sent at 07/14/2021  9:54 AM EDT ----- Would you please call this patient and advise her of the Lipid panel Dr. Allen Norris ordered yesterday? Also, will you confirm with her if she has any insurance?  Thank you!!  Ginger ----- Message ----- From: Lucilla Lame, MD Sent: 07/14/2021   8:24 AM EDT To: Glennie Isle, CMA  Please let the patient know that her cholesterol is elevated while her triglycerides are normal.  She should speak to her primary care provider about whether or not she needs lifestyle modifications or medications.

## 2021-07-19 ENCOUNTER — Other Ambulatory Visit: Payer: Self-pay | Admitting: Nurse Practitioner

## 2021-07-25 ENCOUNTER — Ambulatory Visit: Admission: RE | Admit: 2021-07-25 | Payer: Self-pay | Source: Ambulatory Visit | Admitting: Gastroenterology

## 2021-07-25 HISTORY — DX: Migraine, unspecified, not intractable, without status migrainosus: G43.909

## 2021-07-25 HISTORY — DX: Asymptomatic varicose veins of unspecified lower extremity: I83.90

## 2021-07-25 HISTORY — DX: Motion sickness, initial encounter: T75.3XXA

## 2021-07-25 HISTORY — DX: Fatty (change of) liver, not elsewhere classified: K76.0

## 2021-07-25 HISTORY — DX: Presence of dental prosthetic device (complete) (partial): Z97.2

## 2021-07-25 HISTORY — DX: Personal history of urinary calculi: Z87.442

## 2021-07-25 HISTORY — DX: Gastro-esophageal reflux disease without esophagitis: K21.9

## 2021-07-25 SURGERY — COLONOSCOPY WITH PROPOFOL
Anesthesia: Choice

## 2021-08-15 ENCOUNTER — Ambulatory Visit: Admission: RE | Admit: 2021-08-15 | Payer: Self-pay | Source: Ambulatory Visit

## 2021-08-15 ENCOUNTER — Ambulatory Visit: Payer: Self-pay

## 2021-08-15 ENCOUNTER — Ambulatory Visit: Payer: Self-pay | Admitting: Licensed Clinical Social Worker

## 2021-08-16 ENCOUNTER — Encounter: Payer: Self-pay | Admitting: Nurse Practitioner

## 2021-08-16 ENCOUNTER — Ambulatory Visit (INDEPENDENT_AMBULATORY_CARE_PROVIDER_SITE_OTHER): Payer: Medicaid Other | Admitting: Nurse Practitioner

## 2021-08-16 ENCOUNTER — Other Ambulatory Visit: Payer: Self-pay

## 2021-08-16 VITALS — BP 82/56 | HR 71 | Temp 98.1°F | Wt 165.2 lb

## 2021-08-16 DIAGNOSIS — N1831 Chronic kidney disease, stage 3a: Secondary | ICD-10-CM | POA: Diagnosis not present

## 2021-08-16 DIAGNOSIS — K760 Fatty (change of) liver, not elsewhere classified: Secondary | ICD-10-CM

## 2021-08-16 DIAGNOSIS — Z87442 Personal history of urinary calculi: Secondary | ICD-10-CM

## 2021-08-16 DIAGNOSIS — N183 Chronic kidney disease, stage 3 unspecified: Secondary | ICD-10-CM | POA: Insufficient documentation

## 2021-08-16 DIAGNOSIS — R7301 Impaired fasting glucose: Secondary | ICD-10-CM

## 2021-08-16 DIAGNOSIS — R12 Heartburn: Secondary | ICD-10-CM

## 2021-08-16 DIAGNOSIS — E7800 Pure hypercholesterolemia, unspecified: Secondary | ICD-10-CM

## 2021-08-16 DIAGNOSIS — E538 Deficiency of other specified B group vitamins: Secondary | ICD-10-CM | POA: Diagnosis not present

## 2021-08-16 DIAGNOSIS — R768 Other specified abnormal immunological findings in serum: Secondary | ICD-10-CM | POA: Diagnosis not present

## 2021-08-16 DIAGNOSIS — M2559 Pain in other specified joint: Secondary | ICD-10-CM | POA: Diagnosis not present

## 2021-08-16 DIAGNOSIS — E785 Hyperlipidemia, unspecified: Secondary | ICD-10-CM | POA: Insufficient documentation

## 2021-08-16 DIAGNOSIS — R8281 Pyuria: Secondary | ICD-10-CM

## 2021-08-16 LAB — MICROSCOPIC EXAMINATION

## 2021-08-16 LAB — URINALYSIS, ROUTINE W REFLEX MICROSCOPIC
Bilirubin, UA: NEGATIVE
Glucose, UA: NEGATIVE
Ketones, UA: NEGATIVE
Nitrite, UA: NEGATIVE
Protein,UA: NEGATIVE
Specific Gravity, UA: >1.03 — ABNORMAL HIGH (ref 1.005–1.030)
Urobilinogen, Ur: 0.2 mg/dL (ref 0.2–1.0)
pH, UA: 5.5 (ref 5.0–7.5)

## 2021-08-16 LAB — BAYER DCA HB A1C WAIVED: HB A1C (BAYER DCA - WAIVED): 5.3 % (ref 4.8–5.6)

## 2021-08-16 MED ORDER — MELOXICAM 7.5 MG PO TABS: 7.5000 mg | ORAL_TABLET | Freq: Every day | ORAL | 4 refills | Status: DC

## 2021-08-16 MED ORDER — OMEPRAZOLE 20 MG PO CPDR: 20.0000 mg | DELAYED_RELEASE_CAPSULE | Freq: Every day | ORAL | 4 refills | Status: DC

## 2021-08-16 MED ORDER — PREDNISONE 10 MG PO TABS: ORAL_TABLET | ORAL | 0 refills | Status: DC

## 2021-08-16 NOTE — Assessment & Plan Note (Signed)
Noted on recent MRI, seen by GI.  At this time focus on diet changes, discussed with her son and her.  Provided print out on this to them.  Lipid panel showing elevations, recheck in 6 months and start medication as needed.

## 2021-08-16 NOTE — Patient Instructions (Addendum)
Leodis Binet, FNP   [facility]   [place], [place] [address]   [phone] (Work)     Dieta mediterrnea [place] Diet El trmino "dieta mediterrnea" hace referencia a elecciones de alimentacin y de [place] de vida basadas en las tradiciones de los pases ubicados en las costas del mar Mediterrneo. Se centra en comer ms frutas, verduras, cereales integrales, legumbres, frutos secos, semillas y grasas cardiosaludables, y comer menos productos lcteos, carne, huevos y alimentos procesados con azcar agregada, sal y Wendee Copp. Se ha demostrado que esta dieta ayuda a prevenir determinadas afecciones y a Coca-Cola que padecen enfermedades crnicas, como enfermedades renales y cardacas. Consejos para seguir Photographer las etiquetas de los alimentos Verificar el tamao de la porcin de los alimentos envasados. En el caso del arroz y la pasta, el tamao de la porcin se refiere a la cantidad de alimento cocido, no seco. Controlar la cantidad total de grasa de los alimentos envasados. Evite los que contienen grasas saturadas o trans. Lea la lista de ingredientes para determinar si los alimentos contienen azcares agregados, como jarabe de maz. Al ir de compras  Compre una variedad de alimentos que ofrezcan una dieta equilibrada, por ejemplo: Frutas y verduras frescas. Cereales, frijoles, frutos secos y semillas. Algunos de estos productos pueden estar disponibles sueltos o en grandes cantidades (a granel). Mariscos frescos. Aves y [place]. Productos lcteos con bajo contenido de Welsh. Compre ingredientes frescos en lugar de alimentos preenvasados. Compre frutas y verduras de estacin frescas en mercados de granjeros locales. Compre frutas y verduras congeladas solas. Si no tiene acceso a Marketing executive de buena calidad, compre camarones congelados precocidos o pescado en lata, como atn, salmn o sardinas. Surta su despensa para tener  siempre determinados productos a mano, por ejemplo, aceite de oliva, atn en lata, tomates en lata, arroz, pasta y frijoles. Al cocinar Cocine con aceite de oliva extra virgen en lugar de usar mantequilla u otros aceites vegetales. Use las carnes como guarnicin y las verduras o los cereales como plato principal. Esto significa consumir porciones pequeas de carne o agregarla a la pasta o a los guisos en pequeas cantidades. Use frijoles o verduras en lugar de carne en platos comunes como chili o lasaa. Experimente con diferentes mtodos de coccin. Intente asar, cocer al vapor y saltear verduras. Agregue verduras congeladas a sopas, guisos, pasta o arroz. Agregue frutos secos o semillas para cubrir la porcin de grasas saludables y protena vegetal en cada comida. Puede agregarlos a yogures, ensaladas o platos con verduras. [place] verduras con aceite de [place], [place] de [place], ajo y Patent attorney. Planificacin de las comidas Planifique una comida vegetariana un da a la semana. Si es posible, intente agregar [employer]. Coma mariscos dos o ms veces por semana. Tenga a mano bocadillos saludables, por ejemplo: Bastones de verduras con humus. Yogur griego. Mezcla de frutos secos y frutas. Consuma comidas equilibradas toda la semana. Esto puede comprender lo siguiente: Fruta: 2 o 3 porciones por Training and development officer. Verdura: 4 o 5 porciones por Training and development officer. Productos lcteos descremados: 2 porciones por Training and development officer. Carne magra, de ave o de pescado: 1 porcin por Training and development officer. Frijoles y legumbres: 2 o ms porciones por semana. Frutos secos y semillas: 54 o 2 porciones por Training and development officer. Cereales integrales: 6 a 8 porciones por da. Aceite de oliva extra virgen: 3 o 4 porciones por da. Limite las carnes rojas y los dulces a solo unas  porciones al mes. Estilo de vida  Cocine y coma en familia, cuando sea posible. Beba suficiente lquido como para Theatre manager la orina de color amarillo plido. Palo. Esto puede comprender lo siguiente: Ejercicios aerbicos, Film/video editor. Actividades recreativas, como jardinera, caminatas o tareas domsticas. Intente dormir de 7 a 8 horas todas las noches. Si el mdico se lo recomienda, consuma vino tinto con moderacin. Esto significa 1 vaso por da para mujeres que no estn embarazadas y 2 vasos por da para los hombres. Un vaso de vino equivale a 5 oz (150 ml). Qu alimentos debo comer? Frutas Manzanas. Damascos. Aguacate. Bayas. Bananas. Cerezas. Dtiles. Higos. Uvas. Limones. Meln. Naranjas. Duraznos. Ciruelas. Adeline. Verduras Alcachofas. Remolachas. Brcoli. Repollo. Zanahorias. Augustin Coupe. Judas verdes. Acelga. Col rizada. Espinaca. Cebollas. Puerro. Guisantes. Calabaza. Tomates. Pimientos. Rbanos. Granos Pastas integrales. Arroz integral. Gavin Potters burgol. Polenta. Cuscs. Pan integral. Avena. Quinua. Carnes y otras protenas Optometrist. Almendras. Semillas de girasol. Piones. Manes. Bacalao. Salmn. Vieiras. Camarones. Atn. Tilapia. Almejas. Ostras. Huevos. Carne de ave sin piel. Lcteos Leche con bajo contenido de [place]. Queso. Yogur griego. Grasas y aceites Aceite de oliva extra virgen. Aceite de aguacate. Aceite de semillas de uva. [employer]et [employer]. Vino tinto. T de hierbas. Dulces y postres Yogur griego con miel. Manzanas asadas. Peras asadas. Mezcla de frutos secos. Alios y condimentos Alb[place]aca. Cilantro. Coriandro. Comino. Menta. Perejil. Salvia. Westley Hummer. Estragn. Ajo. Organo. Tomillo. Pimienta. [place]. Hummus. Salsa de tomate. Aceitunas. Hongos. Es posible que los productos que se enumeran ms [place] no constituyan una lista completa de los alimentos y las bebidas que puede tomar. Consulte a un nutricionista para obtener ms informacin. Qu alimentos debo limitar? Esta es una lista de los alimentos que se deben comer con muy poca frecuencia o solo en ocasiones  especiales. Lambert Mody Frutas enlatadas con almbar. Verduras Papas fritas. Granos Comidas con arroz o pasta preenvasada. Cereales preenvasados con azcar agregada. Colaciones preenvasadas con azcar agregada. Carnes y otras protenas Carne de vaca. Cerdo. Cordero. Carne de ave con piel. Perros calientes. Tocino. Lcteos Helado. Phoebe Sharps. Leche entera. Grasas y [employer]. Aceite de canola. Aceite vegetal. Grasa de carne de res. [place]. Bebidas Jugos. Refrescos endulzados con azcar. Cerveza. Licores y bebidas espirituosas. Dulces y SunTrust. Bizcochuelos. Pasteles. Caramelos. Alios y condimentos Mayonesa. Salsas y adobos listos para consumir. Es posible que los productos que se enumeran ms [place] no constituyan una lista completa de los alimentos y las bebidas que debe limitar. Consulte a un nutricionista para obtener ms informacin. Resumen La dieta mediterrnea implica elecciones de alimentos y de estilo de vida. Consuma una variedad de frutas y verduras frescas, frijoles, frutos secos, semillas y cereales integrales. Limite la cantidad de carnes rojas y dulces. Si el mdico se lo recomienda, consuma vino tinto con moderacin. Esto significa 1 vaso por da para mujeres que no estn embarazadas y 2 vasos por da para los hombres. Un vaso de vino equivale a 5 oz (150 ml). Esta informacin no tiene Marine scientist el consejo del mdico. Asegrese de hacerle al mdico cualquier pregunta que tenga. Document Revised: 03/29/2021 Document Reviewed: 10/03/2019 Elsevier Patient Education  2022 [employer].

## 2021-08-16 NOTE — Assessment & Plan Note (Signed)
Recheck urine today due to ongoing pain and check BMP.

## 2021-08-16 NOTE — Assessment & Plan Note (Signed)
Noted last labs during kidney stone, recheck today with BMP.

## 2021-08-16 NOTE — Chronic Care Management (AMB) (Signed)
Care Management Clinical Social Work Note  08/16/2021 Name: Brittney Hampton MRN: [id number] DOB: [birth year]  Brittney Hampton Brittney Hampton is a 55 y.o. year old female who is a primary care patient of Cannady, Barbaraann Faster, NP.  The Care Management team was consulted for assistance with chronic disease management and coordination needs.  Engaged with patient by telephone for follow up visit in response to provider referral for social work chronic care management and care coordination services  Consent to Services:  Ms. Brittney Hampton was given information about Care Management services today including:  Care Management services includes personalized support from designated clinical staff supervised by her physician, including individualized plan of care and coordination with other care providers 24/7 contact phone numbers for assistance for urgent and routine care needs. The patient may stop case management services at any time by phone call to the office staff.  Patient agreed to services and consent obtained.   Consent to Services:  The patient was given information about Care Management services, agreed to services, and gave verbal consent prior to initiation of services.  Please see initial visit note for detailed documentation.   Patient agreed to services today and consent obtained.  Engaged with patient by phone in response to provider referral for social work care coordination services:  Assessment/Interventions:  Patient continues to maintain positive progress with care plan goals. She was awarded Medicaid-Wellcare and is not in need of CAFA. No additional concerns noted See Care Plan below for interventions and patient self-care activities.  Follow up Plan: Patient does not require or desire continued follow-up by CCM LCSW. Will contact the office if needed  SDOH (Social Determinants of Health) assessments and interventions performed:    Advanced Directives Status: Not addressed in this  encounter.  Care Plan  No Known Allergies  Outpatient Encounter Medications as of 08/15/2021  Medication Sig   amitriptyline (ELAVIL) 25 MG tablet Take 2 tablets (50 mg total) by mouth at bedtime.   hydrOXYzine (VISTARIL) 25 MG capsule TAKE 1 CAPSULE(25 MG) BY MOUTH AT BEDTIME AS NEEDED FOR ANXIETY OR SLEEP   meclizine (ANTIVERT) 12.5 MG tablet Take 1 tablet (12.5 mg total) by mouth 3 (three) times daily as needed for dizziness.   meloxicam (MOBIC) 7.5 MG tablet Take by mouth.   polyethylene glycol-electrolytes (GAVILYTE-N WITH FLAVOR PACK) 420 g solution Drink one 8 oz glass every 20 mins until entire container is finished starting at 5:00pm on 07/24/21   rizatriptan (MAXALT) 5 MG tablet Take 1 tablet (5 mg total) by mouth as needed for migraine. May repeat in 2 hours if needed   Facility-Administered Encounter Medications as of 08/15/2021  Medication   cyanocobalamin ((VITAMIN B-12)) injection 1,000 mcg    Patient Active Problem List   Diagnosis Date Noted   Insomnia 06/20/2021   Heart burn 05/20/2021   Hepatic hemangioma 04/30/2021   Hepatic steatosis 04/30/2021   ANA positive 04/04/2021   B12 deficiency 04/02/2021   Uninsured 04/01/2021   Chronic migraine without aura without status migrainosus, not intractable 04/01/2021   Joint pain 04/01/2021   IFG (impaired fasting glucose) 04/01/2021   Financial difficulties 04/01/2021   Aortic atherosclerosis ([place]) 03/31/2021    Conditions to be addressed/monitored:  Insomnia, Chronic Migraine, Joint Pain ; Uninsured  Care Plan : General Social Work (Adult)  Updates made by Rebekah Chesterfield, LCSW since 08/16/2021 12:00 AM     Problem: Barriers to Treatment      Goal: Barriers to Treatment Identified and Managed  Start Date: 05/09/2021  This Visit's Progress: On track  Recent Progress: On track  Priority: High  Note:   Current barriers:    Financial strain, Limited social support, Transportation, and Mental Health Concerns   Clinical Goals: Over the next 120 days, patient will work with SW, Social worker and therapist to reduce or manage symptoms of agitation, mood instability, and/or stress Clinical Interventions:  Patient has been awarded Medicaid-Wellcare 09/22: CCM LCSW utilized [employer] to assist with translation ([id number]) Patient is currently uninsured and is experiencing financial strain regarding medical bills. Patient has been unemployed for two months stating it "feels impossible sometimes" CCM LCSW inquired about supportive documentation for submission of CAFA application. Patient prefers to meet in person 06/03/21 to review ppwk 09/23: CCM LCSW, patient, and son reviewed ppwk and discussed additional ppwk. Letter of Support provided for patient's cousin to sign and have notarized 10/14: CCM LCSW met with patient and son, who submitted supportive documentation for CAFA application. CCM LCSW informed patient via voicemail of one additional form that needs to be completed, prior to submitting to [facility] Financial Department 10/24: Patient completed Financial Application. CCM LCSW submitted the complete application with all of the supportive documentation obtained to [place]: Customer Service 12/05: Patient reported receiving phone calls form Financial Assistance Dept; however, they continued to disconnect. She received a letter in the mail; however, she no longer has access to it (was left in vehicle while being serviced) CCM LCSW informed patient of CAFA and how to apply for program. Patient provided verbal consent for LCSW to mail application to address on file. Patient was strongly encouraged to contact LCSW with any questions or concerns with completion of application 24/58: Patient shared she has received the application via mail. CCM LCSW reviewed informed patient of supportive documentation and answered questions. LCSW will mail letter of support 10/13: Patient shared that she has obtained Letter of  Support and additional supportive documentation for submission of CAFA application. Follow up appt scheduled for 06/24/21 with CCM LCSW to review Patient was informed that she could apply for financial assistance with [facility] over phone Patient's minor son provides patient with transportation to appointments. CCM LCSW discussed [facility] program and was provided verbal consent to complete referral  Assessment of needs, barriers , agencies contacted, as well as how impacting Review various resources, discussed options and provided patient information about  Research officer, trade union  and Various insurance options ( [employer], [employer]  and [place]) Solution-Focused Strategies, Active listening / Reflection utilized , Emotional Supportive Provided, Verbalization of feelings encouraged , and Made referral to [employer] 1:1 collaboration with primary care provider regarding development and update of comprehensive plan of care as evidenced by provider attestation and co-signature Inter-disciplinary care team collaboration (see longitudinal plan of care) Patient Goals/Self-Care Activities: Over the next 120 days Call [employer] [phone]  Follow up on insurance options ( [place], [employer] Assistance and [place]) Contact office with any questions or concerns Attend all scheduled appts with providers        Christa See, MSW, [email or web address] Phone [phone] 10:25 AM

## 2021-08-16 NOTE — Addendum Note (Signed)
Addended by: Marnee Guarneri T on: 08/16/2021 04:12 PM   Modules accepted: Orders

## 2021-08-16 NOTE — Assessment & Plan Note (Signed)
Suspect this is reason for cough, start Omeprazole 20 MG daily and reassess at next visit.

## 2021-08-16 NOTE — Assessment & Plan Note (Signed)
B12 injection given today. Check level next visit.

## 2021-08-16 NOTE — Progress Notes (Signed)
BP (!) 82/56   Pulse 71   Temp 98.1 F (36.7 C)   Wt 165 lb 3.2 oz (74.9 kg)   LMP 12/06/2016 (Approximate)   SpO2 97%   BMI 27.49 kg/m    Subjective:    Patient ID: Brittney Hampton, female    DOB: 1965-09-20, 55 y.o.   MRN: [id number]  HPI: Brittney Hampton is a 55 y.o. female  Chief Complaint  Patient presents with   Joint Pain    Patient is here to follow up on joint pain. Patient states the pain she has been having is still there in her lower back and in her knee. Patient states she has the pain in her knee when she kneels. Patient states she wants to know where and why she has the pain and would like to discuss with provider.    Knee Pain    Patient states the knee discomfort started about a month ago and it was just weakness and now it is pain and soreness. Patient states the discomfort and pain does not fully go away when she has the episodes. Patient denies having tried any over the counter medication.    Cough    Patient states she is having a dry cough and would like to discuss with provider is there something that she can prescribed to help with the patient. Patient states her symptoms have been for a while at least 3 months and she would like to discuss and denies coughing up any phlegm or mucus. Patient states when she coughs for a long time she notices loose phlegm in her throat.    Interpreter at bedside to assist.  JOINT/KNEE PAIN Saw rheumatology on 05/18/21 -- due to positive ANA, labs with them were negative for RA.  Imaging done, but she reports these were normal -- unable to view in Epic.  She continues to have pain -- starts at right side of hip and then travels all the way down her leg, causes numbness down leg and weakness in knee.  Pain in both knees.  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  She continues on B12 injections, which rheumatology recommended she continue due to low levels.  Has missed two of  these. Duration: months Involved knee: bilateral Mechanism of injury: unknown Location:anterior Onset: gradual Severity: 7/10  Quality:  aching, burning, and throbbing Frequency: intermittent -- is difficult to stand back up due to pain Radiation: no Aggravating factors: bending and prolonged sitting  Alleviating factors:  Meloxicam   Status: fluctuating Treatments attempted:  Meloxicam   Relief with NSAIDs?:  moderate Weakness with weight bearing or walking: no Sensation of giving way: yes Locking: no Popping: yes Bruising: no Swelling: no Redness: no Paresthesias/decreased sensation: no Fevers: no   HYPERLIPIDEMIA Noted on recent labs with GI and has underlying NAFLD.  GI recommended focus on diet.  Past cholesterol meds: none Supplements: none Aspirin:  no The ASCVD Risk score (Arnett DK, et al., 2019) failed to calculate for the following reasons:   The valid systolic blood pressure range is 90 to 200 mmHg Chest pain:  no Coronary artery disease:  no Family history CAD:  no Family history early CAD:  no   COUGH Reports having a cough on and off for a few months -- start with itching in throat and then lose phlegm.  Clear her throat a lot.  Does have heart burn often -- more so if she  does not eat at the time she is supposed to. Duration: months Circumstances of initial development of cough: nothing Cough severity: mild Cough description: dry Aggravating factors:  nothing Alleviating factors: nothing Status:  stable Treatments attempted: none Wheezing: no Shortness of breath: no Chest pain: no Chest tightness:no Nasal congestion: no Runny nose: no Postnasal drip: no Frequent throat clearing or swallowing: yes Hemoptysis: no Fevers: no Night sweats: no Weight loss: no Heartburn: yes Recent foreign travel: no Tuberculosis contacts: no   Relevant past medical, surgical, family and social history reviewed and updated as indicated. Interim medical history  since our last visit reviewed. Allergies and medications reviewed and updated.  Review of Systems  Constitutional:  Negative for activity change, appetite change, diaphoresis, fatigue and fever.  Respiratory:  Positive for cough. Negative for chest tightness and shortness of breath.   Cardiovascular:  Negative for chest pain, palpitations and leg swelling.  Gastrointestinal: Negative.   Endocrine: Negative for cold intolerance, heat intolerance, polydipsia, polyphagia and polyuria.  Musculoskeletal:  Positive for arthralgias and joint swelling.  Neurological: Negative.   Psychiatric/Behavioral: Negative.     Per HPI unless specifically indicated above     Objective:    BP (!) 82/56   Pulse 71   Temp 98.1 F (36.7 C)   Wt 165 lb 3.2 oz (74.9 kg)   LMP 12/06/2016 (Approximate)   SpO2 97%   BMI 27.49 kg/m   Wt Readings from Last 3 Encounters:  08/16/21 165 lb 3.2 oz (74.9 kg)  07/13/21 168 lb 6.4 oz (76.4 kg)  06/20/21 170 lb (77.1 kg)    Physical Exam Vitals and nursing note reviewed.  Constitutional:      General: She is awake. She is not in acute distress.    Appearance: She is well-developed and well-groomed. She is not ill-appearing or toxic-appearing.  HENT:     Head: Normocephalic.     Right Ear: Hearing normal.     Left Ear: Hearing normal.  Eyes:     General: Lids are normal.        Right eye: No discharge.        Left eye: No discharge.     Conjunctiva/sclera: Conjunctivae normal.     Pupils: Pupils are equal, round, and reactive to light.  Neck:     Thyroid: No thyromegaly.     Vascular: No carotid bruit.  Cardiovascular:     Rate and Rhythm: Normal rate and regular rhythm.     Heart sounds: Normal heart sounds. No murmur heard.   No gallop.  Pulmonary:     Effort: Pulmonary effort is normal. No accessory muscle usage or respiratory distress.     Breath sounds: Normal breath sounds.  Abdominal:     General: Bowel sounds are normal. There is no  distension.     Palpations: Abdomen is soft.     Tenderness: There is no abdominal tenderness.  Musculoskeletal:     Cervical back: Normal range of motion and neck supple. No rigidity or crepitus. No muscular tenderness. Normal range of motion.     Right knee: Crepitus present. No swelling, erythema, lacerations or bony tenderness. Normal range of motion. Tenderness present over the patellar tendon.     Instability Tests: Anterior drawer test negative. Posterior drawer test negative.     Left knee: Crepitus present. No swelling, erythema, lacerations or bony tenderness. Normal range of motion. Tenderness present over the patellar tendon.     Instability Tests: Anterior drawer test negative. Posterior  drawer test negative.     Right lower leg: No edema.     Left lower leg: No edema.  Lymphadenopathy:     Cervical: No cervical adenopathy.  Skin:    General: Skin is warm and dry.  Neurological:     Mental Status: She is alert and oriented to person, place, and time.     Sensory: Sensation is intact.     Motor: Motor function is intact.     Coordination: Coordination is intact.     Gait: Gait is intact.     Deep Tendon Reflexes: Reflexes are normal and symmetric.     Reflex Scores:      Brachioradialis reflexes are 2+ on the right side and 2+ on the left side.      Patellar reflexes are 2+ on the right side and 2+ on the left side. Psychiatric:        Attention and Perception: Attention normal.        Mood and Affect: Mood normal.        Behavior: Behavior is cooperative.        Thought Content: Thought content normal.        Judgment: Judgment normal.   Results for orders placed or performed in visit on 07/13/21  Lipid panel  Result Value Ref Range   Cholesterol, Total 221 (H) 100 - 199 mg/dL   Triglycerides 103 0 - 149 mg/dL   HDL 66 >39 mg/dL   VLDL Cholesterol Cal 18 5 - 40 mg/dL   LDL Chol Calc (NIH) 137 (H) 0 - 99 mg/dL   Chol/HDL Ratio 3.3 0.0 - 4.4 ratio      Assessment  & Plan:   Problem List Items Addressed This Visit       Digestive   Hepatic steatosis    Noted on recent MRI, seen by GI.  At this time focus on diet changes, discussed with her son and her.  Provided print out on this to them.  Lipid panel showing elevations, recheck in 6 months and start medication as needed.        Endocrine   IFG (impaired fasting glucose)    Noted on recent labs on review. Check A1c today.        Genitourinary   CKD (chronic kidney disease) stage 3, GFR 30-59 ml/min (HCC) - Primary    Noted last labs during kidney stone, recheck today with BMP.        Other   ANA positive    Seen by rheumatology with RA ruled out, will place referral to ortho for further assessment patient pain.      B12 deficiency    B12 injection given today. Check level next visit.      Elevated low density lipoprotein (LDL) cholesterol level    Lipid panel showing elevations, recheck in 6 months and start medication as needed.      Heart burn    Suspect this is reason for cough, start Omeprazole 20 MG daily and reassess at next visit.        History of kidney stones    Recheck urine today due to ongoing pain and check BMP.      Relevant Orders   Basic metabolic panel   Bayer DCA Hb A1c Waived   CBC with Differential/Platelet   Urinalysis, Routine w reflex microscopic   Joint pain    Ongoing, seen by rheumatology with RA ruled out.  Has ongoing pain with benefit from Meloxicam.  Refills sent in.  Referral to ortho for further recommendations.  Trial Prednisone taper, which offered benefit in past.      Relevant Orders   Ambulatory referral to Orthopedics     Follow up plan: Return in about 4 weeks (around 09/13/2021).

## 2021-08-16 NOTE — Assessment & Plan Note (Signed)
Noted on recent labs on review. Check A1c today.

## 2021-08-16 NOTE — Patient Instructions (Signed)
Visit Information  Thank you for taking time to visit with me today. Please don't hesitate to contact me if I can be of assistance to you before our next scheduled telephone appointment.   Please call the care guide team at [phone] if you need to cancel or reschedule your appointment.   If you are experiencing a Mental Health or [place] or need someone to talk to, please call [phone]   Patient verbalizes understanding of instructions provided today and agrees to view in [place].   No further follow up required: Patient has obtained insurance through [facility]  Christa See, MSW, [email or web address] Phone [phone] 10:27 AM

## 2021-08-16 NOTE — Assessment & Plan Note (Addendum)
Ongoing, seen by rheumatology with RA ruled out.  Has ongoing pain with benefit from Meloxicam.  Refills sent in.  Referral to ortho for further recommendations.  Trial Prednisone taper, which offered benefit in past.

## 2021-08-16 NOTE — Assessment & Plan Note (Signed)
Seen by rheumatology with RA ruled out, will place referral to ortho for further assessment patient pain.

## 2021-08-16 NOTE — Assessment & Plan Note (Signed)
Lipid panel showing elevations, recheck in 6 months and start medication as needed.

## 2021-08-17 LAB — CBC WITH DIFFERENTIAL/PLATELET
Basophils Absolute: 0 10*3/uL (ref 0.0–0.2)
Basos: 1 %
EOS (ABSOLUTE): 0.1 10*3/uL (ref 0.0–0.4)
Eos: 4 %
Hematocrit: 46.3 % (ref 34.0–46.6)
Hemoglobin: 15.6 g/dL (ref 11.1–15.9)
Immature Grans (Abs): 0 10*3/uL (ref 0.0–0.1)
Immature Granulocytes: 0 %
Lymphocytes Absolute: 1.7 10*3/uL (ref 0.7–3.1)
Lymphs: 48 %
MCH: 30.4 pg (ref 26.6–33.0)
MCHC: 33.7 g/dL (ref 31.5–35.7)
MCV: 90 fL (ref 79–97)
Monocytes Absolute: 0.2 10*3/uL (ref 0.1–0.9)
Monocytes: 7 %
Neutrophils Absolute: 1.4 10*3/uL (ref 1.4–7.0)
Neutrophils: 40 %
Platelets: 247 10*3/uL (ref 150–450)
RBC: 5.13 x10E6/uL (ref 3.77–5.28)
RDW: 12.1 % (ref 11.7–15.4)
WBC: 3.5 10*3/uL (ref 3.4–10.8)

## 2021-08-17 LAB — BASIC METABOLIC PANEL
BUN/Creatinine Ratio: 20 (ref 9–23)
BUN: 13 mg/dL (ref 6–24)
CO2: 23 mmol/L (ref 20–29)
Calcium: 9.6 mg/dL (ref 8.7–10.2)
Chloride: 104 mmol/L (ref 96–106)
Creatinine, Ser: 0.64 mg/dL (ref 0.57–1.00)
Glucose: 92 mg/dL (ref 70–99)
Potassium: 4.4 mmol/L (ref 3.5–5.2)
Sodium: 141 mmol/L (ref 134–144)
eGFR: 106 mL/min/{1.73_m2} (ref >59)

## 2021-08-17 NOTE — Progress Notes (Signed)
Contacted via [place] morning [place], you labs have returned and overall are normal, including kidney and liver function.  Any questions? Keep being amazing!!  Thank you for allowing me to participate in your care.  I appreciate you. Kindest regards, Caz Weaver

## 2021-08-18 ENCOUNTER — Other Ambulatory Visit: Payer: Self-pay | Admitting: Nurse Practitioner

## 2021-08-18 NOTE — Telephone Encounter (Signed)
Requested Prescriptions  Pending Prescriptions Disp Refills  . hydrOXYzine (VISTARIL) 25 MG capsule [Pharmacy Med Name: HYDROXYZINE PAMOATE 25MG  CAPSULES] 30 capsule 0    Sig: TAKE 1 CAPSULE(25 MG) BY MOUTH AT BEDTIME AS NEEDED FOR ANXIETY OR SLEEP     Ear, Nose, and Throat:  Antihistamines Passed - 08/18/2021  6:21 AM      Passed - Valid encounter within last 12 months    Recent Outpatient Visits          2 days ago Stage 3a chronic kidney disease ([place])   [facility] Cannady, Jolene T, NP   1 month ago B12 deficiency   [facility] McElwee, Lauren A, NP   3 months ago Chronic migraine without aura without status migrainosus, not intractable   [place], Jolene T, NP   4 months ago Chronic migraine without aura without status migrainosus, not intractable   [place], Lauren A, NP   4 months ago Encounter to establish care   [place], Barbaraann Faster, NP      Future Appointments            In 1 month Cannady, Barbaraann Faster, NP [employer]

## 2021-08-19 LAB — URINE CULTURE

## 2021-08-29 ENCOUNTER — Telehealth: Payer: Self-pay

## 2021-08-29 NOTE — Telephone Encounter (Signed)
Please send pt hard copy of her latest lab work and diabetic diet in [place]- pt has no Internet access.  Pt wanted to know what her HgbA1c nuber was.

## 2021-09-01 NOTE — Telephone Encounter (Signed)
Encounter routed to C.Dimas, CMA to notify patient of her recent lab results and notify her that we are unavailable to convert her recent labs results from [place] to [place].

## 2021-09-02 NOTE — Telephone Encounter (Signed)
Called patient and answered all questions regarding labs.

## 2021-09-19 ENCOUNTER — Ambulatory Visit (INDEPENDENT_AMBULATORY_CARE_PROVIDER_SITE_OTHER): Payer: Medicaid Other

## 2021-09-19 ENCOUNTER — Other Ambulatory Visit: Payer: Self-pay

## 2021-09-19 ENCOUNTER — Encounter: Payer: Self-pay | Admitting: Nurse Practitioner

## 2021-09-19 DIAGNOSIS — E538 Deficiency of other specified B group vitamins: Secondary | ICD-10-CM

## 2021-09-23 ENCOUNTER — Ambulatory Visit: Payer: Medicaid Other | Admitting: Nurse Practitioner

## 2021-10-17 ENCOUNTER — Ambulatory Visit: Payer: Medicaid Other

## 2021-10-20 ENCOUNTER — Ambulatory Visit: Payer: Medicaid Other

## 2021-10-24 ENCOUNTER — Other Ambulatory Visit: Payer: Self-pay

## 2021-10-24 ENCOUNTER — Ambulatory Visit (INDEPENDENT_AMBULATORY_CARE_PROVIDER_SITE_OTHER): Payer: Medicaid Other | Admitting: Nurse Practitioner

## 2021-10-24 ENCOUNTER — Encounter: Payer: Self-pay | Admitting: Nurse Practitioner

## 2021-10-24 ENCOUNTER — Ambulatory Visit: Payer: Medicaid Other | Admitting: Nurse Practitioner

## 2021-10-24 ENCOUNTER — Ambulatory Visit: Payer: Medicaid Other

## 2021-10-24 VITALS — BP 109/76 | HR 73 | Temp 97.8°F | Wt 168.2 lb

## 2021-10-24 DIAGNOSIS — M79604 Pain in right leg: Secondary | ICD-10-CM | POA: Diagnosis not present

## 2021-10-24 DIAGNOSIS — I83813 Varicose veins of bilateral lower extremities with pain: Secondary | ICD-10-CM | POA: Diagnosis not present

## 2021-10-24 DIAGNOSIS — Z114 Encounter for screening for human immunodeficiency virus [HIV]: Secondary | ICD-10-CM

## 2021-10-24 DIAGNOSIS — K219 Gastro-esophageal reflux disease without esophagitis: Secondary | ICD-10-CM | POA: Diagnosis not present

## 2021-10-24 DIAGNOSIS — I700 Atherosclerosis of aorta: Secondary | ICD-10-CM

## 2021-10-24 DIAGNOSIS — E559 Vitamin D deficiency, unspecified: Secondary | ICD-10-CM

## 2021-10-24 DIAGNOSIS — Z1159 Encounter for screening for other viral diseases: Secondary | ICD-10-CM

## 2021-10-24 DIAGNOSIS — E538 Deficiency of other specified B group vitamins: Secondary | ICD-10-CM

## 2021-10-24 DIAGNOSIS — M79605 Pain in left leg: Secondary | ICD-10-CM

## 2021-10-24 MED ORDER — PANTOPRAZOLE SODIUM 40 MG PO TBEC: 40.0000 mg | DELAYED_RELEASE_TABLET | Freq: Every day | ORAL | 3 refills | Status: DC

## 2021-10-24 NOTE — Patient Instructions (Addendum)
Take Tylenol 1000 MG at night + take Magnesium 400 MG at night.    Varicose Veins Varicose veins are veins that have become enlarged, bulged, and twisted. They most often appear in the legs. What are the causes? This condition is caused by damage to the valves in the vein. These valves help blood return to your heart. When they are damaged and they stop working properly, blood may flow backward and back up in the veins near the skin, causing the veins to get larger and appear twisted. The condition can result from any issue that causes blood to back up, like pregnancy, prolonged standing, or obesity. What increases the risk? The following factors may make you more likely to develop this condition: Being on your feet a lot. Being pregnant. Being overweight. Smoking. Having had a previous deep vein thrombosis or having a thrombotic disorder. Aging. The risk increases with age. Having a condition called Klippel-Trenaunay syndrome. What are the signs or symptoms? Symptoms of this condition include: Bulging, twisted, and bluish veins. A feeling of heaviness in your legs. This may be worse at the end of the day. Leg pain. This may be worse at the end of the day. Swelling in the leg. Changes in skin color over the veins. Swelling or pain in the legs can limit your activities. Your symptoms may get worse when you sit or stand for long periods of time. How is this diagnosed? This condition may be diagnosed based on: Your symptoms, family history, activity levels, and lifestyle. A physical exam. You may also have tests, including an ultrasound or X-ray. How is this treated? Treatment for this condition may involve: Avoiding sitting or standing in one position for long periods of time. Wearing compression stockings. These stockings help to prevent blood clots and reduce swelling in the legs. Raising (elevating) the legs when resting. Losing weight. Exercising regularly. If you have  persistent symptoms or want to improve the way your varicose veins look, you may choose to have a procedure to close the varicose veins off or to remove them. Nonsurgical treatments to close off the veins include: Sclerotherapy. In this treatment, a solution is injected into a vein to close it off. Laser treatment. The vein is heated with a laser to close it off. Radiofrequency vein ablation. An electrical current produced by radio waves is used to close off the vein. Surgical treatments to remove the veins include: Phlebectomy. In this procedure, the veins are removed through small incisions made over the veins. Vein ligation and stripping. In this procedure, incisions are made over the veins. The veins are then removed after being tied (ligated) with stitches (sutures). Follow these instructions at home: Medicines Take over-the-counter and prescription medicines only as told by your health care provider. If you were prescribed an antibiotic medicine, use it as told by your health care provider. Do not stop using the antibiotic even if you start to feel better. Activity Walk as much as possible. Walking increases blood flow. This helps blood return to the heart and takes pressure off your veins. Do not stand or sit in one position for a long period of time. Do not sit with your legs crossed. Avoid sitting for a long time without moving. Get up to take short walks every 1-2 hours. This is important to improve blood flow and breathing. Ask for help if you feel weak or unsteady. Return to your normal activities as told by your health care provider. Ask your health care provider what  activities are safe for you. Do exercises as told by your health care provider. General instructions  Follow any diet instructions given to you by your health care provider. Elevate your legs at night to above the level of your heart. If you get a cut in the skin over the varicose vein and the vein bleeds: Lie down  with your leg raised. Apply firm pressure to the cut with a clean cloth until the bleeding stops. Place a bandage (dressing) on the cut. Drink enough fluid to keep your urine pale yellow. Do not use any products that contain nicotine or tobacco. These products include cigarettes, chewing tobacco, and vaping devices, such as e-cigarettes. If you need help quitting, ask your health care provider. Wear compression stockings as told by your health care provider. Do not wear other kinds of tight clothing around your legs, pelvis, or waist. Keep all follow-up visits. This is important. Contact a health care provider if: The skin around your varicose veins starts to break down. You have more pain, redness, tenderness, or hard swelling over a vein. You are uncomfortable because of pain. You get a cut in the skin over a varicose vein and it will not stop bleeding. Get help right away if: You have chest pain. You have trouble breathing. You have severe leg pain. Summary Varicose veins are veins that have become enlarged, bulged, and twisted. They most often appear in the legs. This condition is caused by damage to the valves in the vein. These valves help blood return to your heart. Treatment for this condition includes frequent movements, wearing compression stockings, losing weight, and exercising regularly. In some cases, procedures are done to close off or remove the veins. Nonsurgical treatments to close off the veins include sclerotherapy, laser therapy, and radiofrequency vein ablation. This information is not intended to replace advice given to you by your health care provider. Make sure you discuss any questions you have with your health care provider. Document Revised: 02/09/2021 Document Reviewed: 02/09/2021 Elsevier Patient Education  [place].

## 2021-10-24 NOTE — Assessment & Plan Note (Signed)
Noted on recent imaging, plan on fasting lipid panel in future.  Monitor and if increased risk will recommend ASA and statin.

## 2021-10-24 NOTE — Progress Notes (Signed)
BP 109/76    Pulse 73    Temp 97.8 F (36.6 C)    Wt 168 lb 3.2 oz (76.3 kg)    LMP 12/06/2016 (Approximate)    SpO2 98%    BMI 27.99 kg/m    Subjective:    Patient ID: Brittney Hampton, female    DOB: [birth year], 56 y.o.   MRN: [id number]  HPI: Brittney Hampton is a 56 y.o. female  Chief Complaint  Patient presents with   Cough    Patient states she is still having an occasional cough after eating dry foods for example popcorn and crackers. Patient has been taking omeprazole but does not seem to help.    leg veins    Patient has dark veins in her legs and feels discomfort at night when sleeping.    Interpreter at bedside to assist with HPI.  GERD Currently taking Omeprazole 20 MG daily.  Continues to cough after her meals - eating dry foods like crackers.  Sometimes feels like she wants to vomit. Has not gone for colonoscopy.  At times she notices her belly get bloated during the week.  Has bowel movement every other to every day -- no straining with these.    Aortic atherosclerosis noted on imaging 12/06/20. GERD control status: uncontrolled Satisfied with current treatment? yes Heartburn frequency: occasional Medication side effects: no  Medication compliance: fluctuating Previous GERD medications: Pepcid and Prilosec Antacid use frequency: none Duration: months Nature: burning Location: epigastric Heartburn duration: 30 minutes Alleviatiating factors: coughing Aggravating factors: dry foods Dysphagia:  coughing with dry foods Odynophagia:  no Hematemesis: no Blood in stool: no EGD: no   VARICOSE VEINS These have been present for two years to top of legs.  Sometimes wears compression hose at home -- but these cause more discomfort.  She notices more pain with these at night when lying on side.  She does not take anything for the pain.  She would like to see vascular before taking medications for pain.   Duration: years Discomfort description:  ill-defined  throughout day and worse at night Pain: yes Location:  upper legs Bilateral: yes Symmetric: yes Severity: 7/10 Onset:  gradual Frequency:  intermittent Symptoms only occur while legs at rest: no Sudden unintentional leg jerking: no Bed partner bothered by leg movements: no LE numbness: yes Decreased sensation: no Weakness: yes Insomnia: yes Daytime somnolence: no Fatigue: yes Alleviating factors: nothing Aggravating factors: worse at night Status: fluctuating Treatments attempted: none  Relevant past medical, surgical, family and social history reviewed and updated as indicated. Interim medical history since our last visit reviewed. Allergies and medications reviewed and updated.  Review of Systems  Constitutional:  Negative for activity change, appetite change, diaphoresis, fatigue and fever.  Respiratory:  Positive for cough. Negative for chest tightness and shortness of breath.   Cardiovascular:  Negative for chest pain, palpitations and leg swelling.  Gastrointestinal: Negative.   Musculoskeletal:  Positive for arthralgias.  Neurological: Negative.   Psychiatric/Behavioral: Negative.     Per HPI unless specifically indicated above     Objective:    BP 109/76    Pulse 73    Temp 97.8 F (36.6 C)    Wt 168 lb 3.2 oz (76.3 kg)    LMP 12/06/2016 (Approximate)    SpO2 98%    BMI 27.99 kg/m   Wt Readings from Last 3 Encounters:  10/24/21 168 lb 3.2 oz (76.3 kg)  08/16/21 165 lb 3.2 oz (74.9 kg)  07/13/21 168 lb 6.4 oz (76.4 kg)    Physical Exam Vitals and nursing note reviewed.  Constitutional:      General: She is awake. She is not in acute distress.    Appearance: She is well-developed and well-groomed. She is not ill-appearing or toxic-appearing.  HENT:     Head: Normocephalic.     Right Ear: Hearing normal.     Left Ear: Hearing normal.  Eyes:     General: Lids are normal.        Right eye: No discharge.        Left eye: No discharge.      Conjunctiva/sclera: Conjunctivae normal.     Pupils: Pupils are equal, round, and reactive to light.  Neck:     Thyroid: No thyromegaly.     Vascular: No carotid bruit.  Cardiovascular:     Rate and Rhythm: Normal rate and regular rhythm.     Pulses:          Dorsalis pedis pulses are 2+ on the right side and 2+ on the left side.       Posterior tibial pulses are 2+ on the right side and 2+ on the left side.     Heart sounds: Normal heart sounds. No murmur heard.   No gallop.     Comments: Spider and varicosities scattered to bilateral legs L>R.  From calf to upper thighs and extending behind knees, some appear to be more inflamed. Pulmonary:     Effort: Pulmonary effort is normal. No accessory muscle usage or respiratory distress.     Breath sounds: Normal breath sounds.  Abdominal:     General: Bowel sounds are normal. There is no distension.     Palpations: Abdomen is soft.     Tenderness: There is no abdominal tenderness.  Musculoskeletal:     Cervical back: Normal range of motion and neck supple. No rigidity or crepitus. No muscular tenderness. Normal range of motion.     Right lower leg: No edema.     Left lower leg: No edema.  Lymphadenopathy:     Cervical: No cervical adenopathy.  Skin:    General: Skin is warm and dry.  Neurological:     Mental Status: She is alert and oriented to person, place, and time.     Sensory: Sensation is intact.     Motor: Motor function is intact.     Coordination: Coordination is intact.     Gait: Gait is intact.     Deep Tendon Reflexes: Reflexes are normal and symmetric.     Reflex Scores:      Brachioradialis reflexes are 2+ on the right side and 2+ on the left side.      Patellar reflexes are 2+ on the right side and 2+ on the left side. Psychiatric:        Attention and Perception: Attention normal.        Mood and Affect: Mood normal.        Behavior: Behavior is cooperative.        Thought Content: Thought content normal.         Judgment: Judgment normal.    Results for orders placed or performed in visit on 08/16/21  Microscopic Examination   BLD  Result Value Ref Range   WBC, UA 0-5 0 - 5 /hpf   RBC 0-2 0 - 2 /hpf   Epithelial Cells (non renal) 0-10 0 - 10 /hpf   Mucus, UA Present (A)  Not Estab.   Bacteria, UA Few (A) None seen/Few   Yeast, UA Present (A) None seen  Urine Culture   Specimen: Urine   UR  Result Value Ref Range   Urine Culture, Routine Final report    Organism ID, Bacteria Comment   Basic metabolic panel  Result Value Ref Range   Glucose 92 70 - 99 mg/dL   BUN 13 6 - 24 mg/dL   Creatinine, Ser 0.64 0.57 - 1.00 mg/dL   eGFR 106 >59 mL/min/1.73   BUN/Creatinine Ratio 20 9 - 23   Sodium 141 134 - 144 mmol/L   Potassium 4.4 3.5 - 5.2 mmol/L   Chloride 104 96 - 106 mmol/L   CO2 23 20 - 29 mmol/L   Calcium 9.6 8.7 - 10.2 mg/dL  Bayer DCA Hb A1c Waived  Result Value Ref Range   HB A1C (BAYER DCA - WAIVED) 5.3 4.8 - 5.6 %  CBC with Differential/Platelet  Result Value Ref Range   WBC 3.5 3.4 - 10.8 x10E3/uL   RBC 5.13 3.77 - 5.28 x10E6/uL   Hemoglobin 15.6 11.1 - 15.9 g/dL   Hematocrit 46.3 34.0 - 46.6 %   MCV 90 79 - 97 fL   MCH 30.4 26.6 - 33.0 pg   MCHC 33.7 31.5 - 35.7 g/dL   RDW 12.1 11.7 - 15.4 %   Platelets 247 150 - 450 x10E3/uL   Neutrophils 40 Not Estab. %   Lymphs 48 Not Estab. %   Monocytes 7 Not Estab. %   Eos 4 Not Estab. %   Basos 1 Not Estab. %   Neutrophils Absolute 1.4 1.4 - 7.0 x10E3/uL   Lymphocytes Absolute 1.7 0.7 - 3.1 x10E3/uL   Monocytes Absolute 0.2 0.1 - 0.9 x10E3/uL   EOS (ABSOLUTE) 0.1 0.0 - 0.4 x10E3/uL   Basophils Absolute 0.0 0.0 - 0.2 x10E3/uL   Immature Granulocytes 0 Not Estab. %   Immature Grans (Abs) 0.0 0.0 - 0.1 x10E3/uL  Urinalysis, Routine w reflex microscopic  Result Value Ref Range   Specific Gravity, UA >1.030 (H) 1.005 - 1.030   pH, UA 5.5 5.0 - 7.5   Color, UA Yellow Yellow   Appearance Ur Clear Clear   Leukocytes,UA Trace  (A) Negative   Protein,UA Negative Negative/Trace   Glucose, UA Negative Negative   Ketones, UA Negative Negative   RBC, UA Trace (A) Negative   Bilirubin, UA Negative Negative   Urobilinogen, Ur 0.2 0.2 - 1.0 mg/dL   Nitrite, UA Negative Negative   Microscopic Examination See below:       Assessment & Plan:   Problem List Items Addressed This Visit       Cardiovascular and Mediastinum   Aortic atherosclerosis ([place]) - Primary    Noted on recent imaging, plan on fasting lipid panel in future.  Monitor and if increased risk will recommend ASA and statin.      Varicose veins of both lower extremities with pain    Chronic, for 2 years with varicosities, worse to upper thighs.  Recommend trial of Tylenol at night 1000 MG and may take another 1000 MG during morning if needed.  Magnesium 400 MG at night for sleep and discomfort.  Recommend compression hose if up on feet for long periods. Referral to vascular.      Relevant Orders   Comprehensive metabolic panel   Iron Binding Cap (TIBC)([facility])   Ferritin   Ambulatory referral to Vascular Surgery     Digestive  GERD (gastroesophageal reflux disease)    Ongoing with poor control and difficulty with swallowing dry foods.  Stop Omeprazole and start Protonix 40 MG daily, discussed with patient.  Referral to GI for further evaluation, she is due for colonoscopy too.  Labs today.      Relevant Medications   pantoprazole (PROTONIX) 40 MG tablet   Other Relevant Orders   Magnesium   Ambulatory referral to Gastroenterology     Other   B12 deficiency    Chronic, ongoing.  Continue monthly injections in office. Check level today.      Relevant Orders   Vitamin B12   CBC with Differential/Platelet   Pain in both lower extremities    Chronic, for 2 years with varicosities, worse to upper thighs.  Recommend trial of Tylenol at night 1000 MG and may take another 1000 MG during morning if needed.  Magnesium 400 MG at night for  sleep and discomfort.  Recommend compression hose if up on feet for long periods. Referral to vascular.  Labs today.      Relevant Orders   TSH   Comprehensive metabolic panel   Iron Binding Cap (TIBC)([facility])   Ferritin   Ambulatory referral to Vascular Surgery   Other Visit Diagnoses     Vitamin D deficiency       History of low levels reported, check today and start supplement as needed.   Relevant Orders   VITAMIN D 25 Hydroxy (Vit-D Deficiency, Fractures)   Encounter for screening for HIV       HIV screening on labs today per guidelines, discussed with patient.   Relevant Orders   HIV Antibody (routine testing w rflx)   Need for hepatitis C screening test       Hep C screening on labs today per guidelines, discussed with patient.   Relevant Orders   Hepatitis C antibody        Follow up plan: Return in about 6 weeks (around 12/05/2021) for Varicose veins and GERD.

## 2021-10-24 NOTE — Assessment & Plan Note (Signed)
Chronic, ongoing.  Continue monthly injections in office. Check level today.

## 2021-10-24 NOTE — Assessment & Plan Note (Addendum)
Chronic, for 2 years with varicosities, worse to upper thighs.  Recommend trial of Tylenol at night 1000 MG and may take another 1000 MG during morning if needed.  Magnesium 400 MG at night for sleep and discomfort.  Recommend compression hose if up on feet for long periods. Referral to vascular.  Labs today.

## 2021-10-24 NOTE — Assessment & Plan Note (Addendum)
Ongoing with poor control and difficulty with swallowing dry foods.  Stop Omeprazole and start Protonix 40 MG daily, discussed with patient.  Referral to GI for further evaluation, she is due for colonoscopy too.  Labs today.

## 2021-10-24 NOTE — Assessment & Plan Note (Signed)
Chronic, for 2 years with varicosities, worse to upper thighs.  Recommend trial of Tylenol at night 1000 MG and may take another 1000 MG during morning if needed.  Magnesium 400 MG at night for sleep and discomfort.  Recommend compression hose if up on feet for long periods. Referral to vascular.

## 2021-10-25 ENCOUNTER — Encounter: Payer: Self-pay | Admitting: Nurse Practitioner

## 2021-10-25 ENCOUNTER — Telehealth: Payer: Self-pay

## 2021-10-25 ENCOUNTER — Other Ambulatory Visit: Payer: Self-pay | Admitting: Nurse Practitioner

## 2021-10-25 DIAGNOSIS — E559 Vitamin D deficiency, unspecified: Secondary | ICD-10-CM | POA: Insufficient documentation

## 2021-10-25 LAB — CBC WITH DIFFERENTIAL/PLATELET
Basophils Absolute: 0 10*3/uL (ref 0.0–0.2)
Basos: 1 %
EOS (ABSOLUTE): 0.2 10*3/uL (ref 0.0–0.4)
Eos: 5 %
Hematocrit: 43 % (ref 34.0–46.6)
Hemoglobin: 14.6 g/dL (ref 11.1–15.9)
Immature Grans (Abs): 0 10*3/uL (ref 0.0–0.1)
Immature Granulocytes: 0 %
Lymphocytes Absolute: 1.6 10*3/uL (ref 0.7–3.1)
Lymphs: 50 %
MCH: 30.5 pg (ref 26.6–33.0)
MCHC: 34 g/dL (ref 31.5–35.7)
MCV: 90 fL (ref 79–97)
Monocytes Absolute: 0.2 10*3/uL (ref 0.1–0.9)
Monocytes: 7 %
Neutrophils Absolute: 1.2 10*3/uL — ABNORMAL LOW (ref 1.4–7.0)
Neutrophils: 37 %
Platelets: 232 10*3/uL (ref 150–450)
RBC: 4.79 x10E6/uL (ref 3.77–5.28)
RDW: 13 % (ref 11.7–15.4)
WBC: 3.3 10*3/uL — ABNORMAL LOW (ref 3.4–10.8)

## 2021-10-25 LAB — COMPREHENSIVE METABOLIC PANEL
ALT: 17 IU/L (ref 0–32)
AST: 19 IU/L (ref 0–40)
Albumin/Globulin Ratio: 1.9 (ref 1.2–2.2)
Albumin: 4.3 g/dL (ref 3.8–4.9)
Alkaline Phosphatase: 106 IU/L (ref 44–121)
BUN/Creatinine Ratio: 23 (ref 9–23)
BUN: 15 mg/dL (ref 6–24)
Bilirubin Total: 0.4 mg/dL (ref 0.0–1.2)
CO2: 21 mmol/L (ref 20–29)
Calcium: 9.3 mg/dL (ref 8.7–10.2)
Chloride: 106 mmol/L (ref 96–106)
Creatinine, Ser: 0.66 mg/dL (ref 0.57–1.00)
Globulin, Total: 2.3 g/dL (ref 1.5–4.5)
Glucose: 98 mg/dL (ref 70–99)
Potassium: 4.4 mmol/L (ref 3.5–5.2)
Sodium: 143 mmol/L (ref 134–144)
Total Protein: 6.6 g/dL (ref 6.0–8.5)
eGFR: 105 mL/min/{1.73_m2} (ref >59)

## 2021-10-25 LAB — VITAMIN D 25 HYDROXY (VIT D DEFICIENCY, FRACTURES): Vit D, 25-Hydroxy: 8.5 ng/mL — ABNORMAL LOW (ref 30.0–100.0)

## 2021-10-25 LAB — IRON AND TIBC
Iron Saturation: 24 % (ref 15–55)
Iron: 78 ug/dL (ref 27–159)
Total Iron Binding Capacity: 320 ug/dL (ref 250–450)
UIBC: 242 ug/dL (ref 131–425)

## 2021-10-25 LAB — HEPATITIS C ANTIBODY: Hep C Virus Ab: NONREACTIVE

## 2021-10-25 LAB — TSH: TSH: 2.4 u[IU]/mL (ref 0.450–4.500)

## 2021-10-25 LAB — FERRITIN: Ferritin: 78 ng/mL (ref 15–150)

## 2021-10-25 LAB — MAGNESIUM: Magnesium: 2.2 mg/dL (ref 1.6–2.3)

## 2021-10-25 LAB — VITAMIN B12: Vitamin B-12: >2000 pg/mL — ABNORMAL HIGH (ref 232–1245)

## 2021-10-25 LAB — HIV ANTIBODY (ROUTINE TESTING W REFLEX): HIV Screen 4th Generation wRfx: NONREACTIVE

## 2021-10-25 MED ORDER — VITAMIN B-12 1000 MCG PO TABS: 1000.0000 ug | ORAL_TABLET | Freq: Every day | ORAL | 4 refills | Status: DC

## 2021-10-25 MED ORDER — CHOLECALCIFEROL 1.25 MG (50000 UT) PO TABS: 1.0000 | ORAL_TABLET | ORAL | 4 refills | Status: DC

## 2021-10-25 NOTE — Progress Notes (Signed)
Contacted via [facility] -- please call and ensure she understands results.  Good afternoon Brittney Hampton, your labs have returned: - B12 level quite in normal range now.  I would recommend we stop monthly injections and start daily oral supplement instead.  I will send in Vitamin B12 1000 MCG and we will continue to monitor levels closely. - Vitamin D level is very low, this is important for bone health.  I am going to send in a higher dose of Vitamin D for you to start taking weekly, then we will recheck next visit. - White blood cell count and neutrophils, one of the white blood cells, are a little low this check.  I would like to recheck this at your next visit 12/05/21, if they continue to be low I may send you to hematology. - Remainder of labs are all normal, including thyroid and iron labs.  Any questions? Keep being amazing!!  Thank you for allowing me to participate in your care.  I appreciate you. Kindest regards, Vicci Reder

## 2021-10-25 NOTE — Telephone Encounter (Signed)
Scheduled for 01/11/2022

## 2021-12-03 NOTE — Patient Instructions (Incomplete)

## 2021-12-05 ENCOUNTER — Other Ambulatory Visit: Payer: Self-pay

## 2021-12-05 ENCOUNTER — Ambulatory Visit (INDEPENDENT_AMBULATORY_CARE_PROVIDER_SITE_OTHER): Payer: Medicaid Other | Admitting: Nurse Practitioner

## 2021-12-05 ENCOUNTER — Ambulatory Visit: Payer: Medicaid Other | Admitting: Nurse Practitioner

## 2021-12-05 ENCOUNTER — Encounter: Payer: Self-pay | Admitting: Nurse Practitioner

## 2021-12-05 VITALS — BP 101/67 | HR 67 | Temp 98.1°F | Ht 65.0 in | Wt 167.4 lb

## 2021-12-05 DIAGNOSIS — M2559 Pain in other specified joint: Secondary | ICD-10-CM

## 2021-12-05 DIAGNOSIS — I83813 Varicose veins of bilateral lower extremities with pain: Secondary | ICD-10-CM | POA: Diagnosis not present

## 2021-12-05 DIAGNOSIS — D729 Disorder of white blood cells, unspecified: Secondary | ICD-10-CM

## 2021-12-05 DIAGNOSIS — E559 Vitamin D deficiency, unspecified: Secondary | ICD-10-CM

## 2021-12-05 DIAGNOSIS — K219 Gastro-esophageal reflux disease without esophagitis: Secondary | ICD-10-CM

## 2021-12-05 MED ORDER — CHOLECALCIFEROL 1.25 MG (50000 UT) PO TABS: 1.0000 | ORAL_TABLET | ORAL | 4 refills | Status: DC

## 2021-12-05 MED ORDER — OMEPRAZOLE 20 MG PO CPDR: 20.0000 mg | DELAYED_RELEASE_CAPSULE | Freq: Two times a day (BID) | ORAL | 4 refills | Status: DC

## 2021-12-05 MED ORDER — GABAPENTIN 600 MG PO TABS: 300.0000 mg | ORAL_TABLET | Freq: Every day | ORAL | 1 refills | Status: DC

## 2021-12-05 NOTE — Patient Instructions (Signed)
Go to this location and gets x-rays:  IMAGING LOCATION: ?[address] [place], [place] [address] ?[phone]  ? ?Neuropathic Pain ?Neuropathic pain is pain caused by damage to the nerves that are responsible for certain sensations in your body (sensory nerves). ?Neuropathic pain can make you more sensitive to pain. Even a minor sensation can feel very painful. This is usually a long-term (chronic) condition that can be difficult to treat. The type of pain differs from person to person. It may: ?Start suddenly (acute), or it may develop slowly and become chronic. ?Come and go as damaged nerves heal, or it may stay at the same level for years. ?Cause emotional distress, loss of sleep, and a lower quality of life. ?What are the causes? ?The most common cause of this condition is diabetes. Many other diseases and conditions can also cause neuropathic pain. Causes of neuropathic pain can be classified as: ?Toxic. This is caused by medicines and chemicals. The most common causes of toxic neuropathic pain is damage from medicines that kill cancer cells (chemotherapy) or alcohol abuse. ?Metabolic. This can be caused by: ?Diabetes. ?Lack of vitamins like B12. ?Traumatic. Any injury that cuts, crushes, or stretches a nerve can cause damage and pain. ?Compression-related. If a sensory nerve gets trapped or compressed for a long period of time, the blood supply to the nerve can be cut off. ?Vascular. Many blood vessel diseases can cause neuropathic pain by decreasing blood supply and oxygen to nerves. ?Autoimmune. This type of pain results from diseases in which the body's defense system (immune system) mistakenly attacks sensory nerves. Examples of autoimmune diseases that can cause neuropathic pain include lupus and multiple sclerosis. ?Infectious. Many types of viral infections can damage sensory nerves and cause pain. Shingles infection is a common cause of this type of pain. ?Inherited. Neuropathic pain can  be a symptom of many diseases that are passed down through families (genetic). ?What increases the risk? ?You are more likely to develop this condition if: ?You have diabetes. ?You smoke. ?You drink too much alcohol. ?You are taking certain medicines, including chemotherapy or medicines that treat immune system disorders. ?What are the signs or symptoms? ?The main symptom is pain. Neuropathic pain is often described as: ?Burning. ?Shock-like. ?Stinging. ?Hot or cold. ?Itching. ?How is this diagnosed? ?No single test can diagnose neuropathic pain. It is diagnosed based on: ?A physical exam and your symptoms. Your health care provider will ask you about your pain. You may be asked to use a pain scale to describe how bad your pain is. ?Tests. These may be done to see if you have a cause and location of any nerve damage. They include: ?Nerve conduction studies and electromyography to test how well nerve signals travel through your nerves and muscles (electrodiagnostic testing). ?Skin biopsy to evaluate for small fiber neuropathy. ?Imaging studies, such as: ?X-rays. ?CT scan. ?MRI. ?How is this treated? ?Treatment for neuropathic pain may change over time. You may need to try different treatment options or a combination of treatments. Some options include: ?Treating the underlying cause of the neuropathy, such as diabetes, kidney disease, or vitamin deficiencies. ?Stopping medicines that can cause neuropathy, such as chemotherapy. ?Medicine to relieve pain. Medicines may include: ?Prescription or over-the-counter pain medicine. ?Anti-seizure medicine. ?Antidepressant medicines. ?Pain-relieving patches or creams that are applied to painful areas of skin. ?A medicine to numb the area (local anesthetic), which can be injected as a nerve block. ?Transcutaneous nerve stimulation. This uses electrical currents to block painful  nerve signals. The treatment is painless. ?Alternative treatments, such  as: ?Acupuncture. ?Meditation. ?Massage. ?Occupational or physical therapy. ?Pain management programs. ?Counseling. ?Follow these instructions at home: ?Medicines ? ?Take over-the-counter and prescription medicines only as told by your health care provider. ?Ask your health care provider if the medicine prescribed to you: ?Requires you to avoid driving or using machinery. ?Can cause constipation. You may need to take these actions to prevent or treat constipation: ?Drink enough fluid to keep your urine pale yellow. ?Take over-the-counter or prescription medicines. ?Eat foods that are high in fiber, such as beans, whole grains, and fresh fruits and vegetables. ?Limit foods that are high in fat and processed sugars, such as fried or sweet foods. ?Lifestyle ? ?Have a good support system at home. ?Consider joining a chronic pain support group. ?Do not use any products that contain nicotine or tobacco. These products include cigarettes, chewing tobacco, and vaping devices, such as e-cigarettes. If you need help quitting, ask your health care provider. ?Do not drink alcohol. ?General instructions ?Learn as much as you can about your condition. ?Work closely with all your health care providers to find the treatment plan that works best for you. ?Ask your health care provider what activities are safe for you. ?Keep all follow-up visits. This is important. ?Contact a health care provider if: ?Your pain treatments are not working. ?You are having side effects from your medicines. ?You are struggling with tiredness (fatigue), mood changes, depression, or anxiety. ?Get help right away if: ?You have thoughts of hurting yourself. ?Get help right away if you feel like you may hurt yourself or others, or have thoughts about taking your own life. Go to your nearest emergency room or: ?Call 911. ?Call the [facility] at [phone] or 988. This is open 24 hours a day. ?Text the Crisis Text Line at  [phone]. ?Summary ?Neuropathic pain is pain caused by damage to the nerves that are responsible for certain sensations in your body (sensory nerves). ?Neuropathic pain may come and go as damaged nerves heal, or it may stay at the same level for years. ?Neuropathic pain is usually a long-term condition that can be difficult to treat. Consider joining a chronic pain support group. ?This information is not intended to replace advice given to you by your health care provider. Make sure you discuss any questions you have with your health care provider. ?Document Revised: 04/25/2021 Document Reviewed: 04/25/2021 ?Elsevier Patient Education ? [place]. ? ?

## 2021-12-05 NOTE — Assessment & Plan Note (Signed)
Noted on recent labs, has not started weekly supplement.  Recommend she start taking this weekly and then plan for recheck at future visit. ?

## 2021-12-05 NOTE — Assessment & Plan Note (Signed)
Ongoing to lower extremities with numbness on occasion.  Will obtain imaging of lumbar spine as suspect some radiculopathy is present.  Trial Gabapentin 300 MG QHS for pain, discussed with patient and educated her on this medication.  Return in 6 weeks.   ?

## 2021-12-05 NOTE — Progress Notes (Signed)
? ?BP 101/67   Pulse 67   Temp 98.1 ?F (36.7 ?C) (Oral)   Ht _0  (1.651 m)   Wt 167 lb 6.4 oz (75.9 kg)   LMP 12/06/2016 (Approximate)   SpO2 97%   BMI 27.86 kg/m?   ? ?Subjective:  ? ? Patient ID: Brittney Hampton, female    DOB: [birth year], 56 y.o.   MRN: [id number] ? ?HPI: ?Brittney Hampton is a 56 y.o. female ? ?Chief Complaint  ?Patient presents with  ? Gastroesophageal Reflux  ?  Patient states she was prescribed medication for GERD and states she the medication she was given isn't helping and would like to discuss different treatment options at today's visit.   ? Varicose Veins  ? Leg Problem  ?  Patient states she is still experiencing leg cramping that comes and goes and she says it will wake her up out of her sleep and she has to get up and walk around to help with cramping. Patient states she has the discomfort and pain in her low leg below the knee and back of her leg (calf muscle).  ? ?Interpreter at bedside to assist with HPI. ? ?GERD ?Was taking Omeprazole 20 MG daily and we changed to Protonix  40 MG daily last visit with no difference noted.   ? ?Continues to cough after her meals - eating dry foods like crackers + coughs with liquids -- throat feels like peeling.  At times feels like she wants to vomit with this. Has not gone for colonoscopy.  At times she notices her belly get bloated during the week.  Has bowel movement every other to every day -- no straining with these.  She is scheduled to see GI on 01/17/22.   ?GERD control status: uncontrolled ?Satisfied with current treatment? yes ?Heartburn frequency: occasional ?Medication side effects: no  ?Medication compliance: fluctuating ?Previous GERD medications: Pepcid and Prilosec, Protonix ?Antacid use frequency: none ?Duration: months ?Nature: burning ?Location: epigastric ?Heartburn duration: 30 minutes ?Alleviatiating factors: coughing ?Aggravating factors: dry foods ?Dysphagia:  coughing with dry foods ?Odynophagia:   no ?Hematemesis: no ?Blood in stool: no ?EGD: no  ? ?VARICOSE VEINS ?Follow-up for leg discomfort, a referral was placed to vascular last visit  and she has not heard to schedule.   ? ?These have been present for two years to top of legs.  Sometimes wears compression hose at home -- but these cause more discomfort at times.  She notices more pain with these at night when lying on side.  Tylenol causes more discomfort in stomach and does not take anything for pain.   ? ?Has not had imaging of lower back.  Reports some numbness to legs and buttocks when sitting for long time - after she has walked for a long time.  Notices right foot goes numb.  Recent Vitamin D low and supplement was sent in -- she has not started.  Was also noted to have a white blood cell count at 3.3 recent labs. ?Duration: years ?Discomfort description:  ill-defined throughout day and worse at night ?Pain: yes ?Location:  upper legs  L>R ?Bilateral: yes ?Symmetric: yes ?Severity: 7/10 ?Onset:  gradual ?Frequency:  intermittent ?Symptoms only occur while legs at rest: no ?Sudden unintentional leg jerking: no ?Bed partner bothered by leg movements: no ?LE numbness: yes ?Decreased sensation: no ?Weakness: yes ?Insomnia: yes ?Daytime somnolence: no ?Fatigue: yes ?Alleviating factors: nothing ?Aggravating factors: worse at night or when lying down ?Status: fluctuating ?Treatments attempted: compression ? ?  Relevant past medical, surgical, family and social history reviewed and updated as indicated. Interim medical history since our last visit reviewed. ?Allergies and medications reviewed and updated. ? ?Review of Systems  ?Constitutional:  Negative for activity change, appetite change, diaphoresis, fatigue and fever.  ?Respiratory:  Positive for cough. Negative for chest tightness and shortness of breath.   ?Cardiovascular:  Negative for chest pain, palpitations and leg swelling.  ?Gastrointestinal: Negative.   ?Musculoskeletal:  Positive for  arthralgias.  ?Neurological: Negative.   ?Psychiatric/Behavioral: Negative.    ? ?Per HPI unless specifically indicated above ? ?   ?Objective:  ?  ?BP 101/67   Pulse 67   Temp 98.1 ?F (36.7 ?C) (Oral)   Ht _0  (1.651 m)   Wt 167 lb 6.4 oz (75.9 kg)   LMP 12/06/2016 (Approximate)   SpO2 97%   BMI 27.86 kg/m?   ?Wt Readings from Last 3 Encounters:  ?12/05/21 167 lb 6.4 oz (75.9 kg)  ?10/24/21 168 lb 3.2 oz (76.3 kg)  ?08/16/21 165 lb 3.2 oz (74.9 kg)  ?  ?Physical Exam ?Vitals and nursing note reviewed.  ?Constitutional:   ?   General: She is awake. She is not in acute distress. ?   Appearance: She is well-developed and well-groomed. She is not ill-appearing or toxic-appearing.  ?HENT:  ?   Head: Normocephalic.  ?   Right Ear: Hearing normal.  ?   Left Ear: Hearing normal.  ?Eyes:  ?   General: Lids are normal.     ?   Right eye: No discharge.     ?   Left eye: No discharge.  ?   Conjunctiva/sclera: Conjunctivae normal.  ?   Pupils: Pupils are equal, round, and reactive to light.  ?Neck:  ?   Thyroid: No thyromegaly.  ?   Vascular: No carotid bruit.  ?Cardiovascular:  ?   Rate and Rhythm: Normal rate and regular rhythm.  ?   Pulses:     ?     Dorsalis pedis pulses are 2+ on the right side and 2+ on the left side.  ?     Posterior tibial pulses are 2+ on the right side and 2+ on the left side.  ?   Heart sounds: Normal heart sounds. No murmur heard. ?  No gallop.  ?   Comments: Spider and varicosities scattered to bilateral legs L>R.  From calf to upper thighs and extending behind knees, some appear to be more inflamed. ?Pulmonary:  ?   Effort: Pulmonary effort is normal. No accessory muscle usage or respiratory distress.  ?   Breath sounds: Normal breath sounds.  ?Abdominal:  ?   General: Bowel sounds are normal. There is no distension.  ?   Palpations: Abdomen is soft.  ?   Tenderness: There is no abdominal tenderness.  ?Musculoskeletal:  ?   Cervical back: Normal range of motion and neck supple. No  rigidity or crepitus. No muscular tenderness. Normal range of motion.  ?   Right lower leg: No edema.  ?   Left lower leg: No edema.  ?Lymphadenopathy:  ?   Cervical: No cervical adenopathy.  ?Skin: ?   General: Skin is warm and dry.  ?Neurological:  ?   Mental Status: She is alert and oriented to person, place, and time.  ?   Sensory: Sensation is intact.  ?   Motor: Motor function is intact.  ?   Coordination: Coordination is intact.  ?   Gait: Gait  is intact.  ?   Deep Tendon Reflexes: Reflexes are normal and symmetric.  ?   Reflex Scores: ?     Brachioradialis reflexes are 2+ on the right side and 2+ on the left side. ?     Patellar reflexes are 2+ on the right side and 2+ on the left side. ?Psychiatric:     ?   Attention and Perception: Attention normal.     ?   Mood and Affect: Mood normal.     ?   Behavior: Behavior is cooperative.     ?   Thought Content: Thought content normal.     ?   Judgment: Judgment normal.  ? ? ?Results for orders placed or performed in visit on 10/24/21  ?Hepatitis C antibody  ?Result Value Ref Range  ? Hep C Virus Ab Non Reactive Non Reactive  ?HIV Antibody (routine testing w rflx)  ?Result Value Ref Range  ? HIV Screen 4th Generation wRfx Non Reactive Non Reactive  ?TSH  ?Result Value Ref Range  ? TSH 2.400 0.450 - 4.500 uIU/mL  ?Vitamin B12  ?Result Value Ref Range  ? Vitamin B-12 >2000 (H) 232 - 1245 pg/mL  ?VITAMIN D 25 Hydroxy (Vit-D Deficiency, Fractures)  ?Result Value Ref Range  ? Vit D, 25-Hydroxy 8.5 (L) 30.0 - 100.0 ng/mL  ?Comprehensive metabolic panel  ?Result Value Ref Range  ? Glucose 98 70 - 99 mg/dL  ? BUN 15 6 - 24 mg/dL  ? Creatinine, Ser 0.66 0.57 - 1.00 mg/dL  ? eGFR 105 >59 mL/min/1.73  ? BUN/Creatinine Ratio 23 9 - 23  ? Sodium 143 134 - 144 mmol/L  ? Potassium 4.4 3.5 - 5.2 mmol/L  ? Chloride 106 96 - 106 mmol/L  ? CO2 21 20 - 29 mmol/L  ? Calcium 9.3 8.7 - 10.2 mg/dL  ? Total Protein 6.6 6.0 - 8.5 g/dL  ? Albumin 4.3 3.8 - 4.9 g/dL  ? Globulin, Total 2.3 1.5  - 4.5 g/dL  ? Albumin/Globulin Ratio 1.9 1.2 - 2.2  ? Bilirubin Total 0.4 0.0 - 1.2 mg/dL  ? Alkaline Phosphatase 106 44 - 121 IU/L  ? AST 19 0 - 40 IU/L  ? ALT 17 0 - 32 IU/L  ?CBC with Differential/Platelet  ?Result Value Ref Range  ? WBC

## 2021-12-05 NOTE — Assessment & Plan Note (Signed)
Ongoing with poor control and difficulty with swallowing dry foods + on occasion liquids & coughing spells.  Protonix 40 MG daily and change to Omeprazole 20 MG BID.  Is scheduled to see GI on 01/17/22, which will be beneficial to further assess.  She is due for colonoscopy, suspect they will also perform EGD which was discussed with her.  Recheck CBC today. ?

## 2021-12-05 NOTE — Assessment & Plan Note (Signed)
Chronic, for 2 years with varicosities, worse to upper thighs.  Does not tolerate Tylenol or Ibuprofen due to her GERD.  Recommend she continue Magnesium 400 MG at night for sleep and discomfort.  Recommend compression hose if up on feet for long periods. Referral to vascular is in place, will check on this. ?

## 2021-12-06 LAB — CBC WITH DIFFERENTIAL/PLATELET
Basophils Absolute: 0.1 10*3/uL (ref 0.0–0.2)
Basos: 1 %
EOS (ABSOLUTE): 0.2 10*3/uL (ref 0.0–0.4)
Eos: 4 %
Hematocrit: 43 % (ref 34.0–46.6)
Hemoglobin: 14.1 g/dL (ref 11.1–15.9)
Immature Grans (Abs): 0 10*3/uL (ref 0.0–0.1)
Immature Granulocytes: 0 %
Lymphocytes Absolute: 1.9 10*3/uL (ref 0.7–3.1)
Lymphs: 40 %
MCH: 30.3 pg (ref 26.6–33.0)
MCHC: 32.8 g/dL (ref 31.5–35.7)
MCV: 92 fL (ref 79–97)
Monocytes Absolute: 0.3 10*3/uL (ref 0.1–0.9)
Monocytes: 7 %
Neutrophils Absolute: 2.2 10*3/uL (ref 1.4–7.0)
Neutrophils: 48 %
Platelets: 245 10*3/uL (ref 150–450)
RBC: 4.66 x10E6/uL (ref 3.77–5.28)
RDW: 13 % (ref 11.7–15.4)
WBC: 4.6 10*3/uL (ref 3.4–10.8)

## 2021-12-06 NOTE — Progress Notes (Signed)
Please let Brittney Hampton know her CBC returned and levels are normal this check.  Great news!! ?Keep being amazing!!  Thank you for allowing me to participate in your care.  I appreciate you. ?Kindest regards, ?Machael Raine ?

## 2022-01-10 ENCOUNTER — Other Ambulatory Visit (INDEPENDENT_AMBULATORY_CARE_PROVIDER_SITE_OTHER): Payer: Self-pay | Admitting: Nurse Practitioner

## 2022-01-10 DIAGNOSIS — I83813 Varicose veins of bilateral lower extremities with pain: Secondary | ICD-10-CM

## 2022-01-11 ENCOUNTER — Ambulatory Visit (INDEPENDENT_AMBULATORY_CARE_PROVIDER_SITE_OTHER): Payer: Medicaid Other

## 2022-01-11 ENCOUNTER — Ambulatory Visit (INDEPENDENT_AMBULATORY_CARE_PROVIDER_SITE_OTHER): Payer: Medicaid Other | Admitting: Nurse Practitioner

## 2022-01-11 ENCOUNTER — Encounter (INDEPENDENT_AMBULATORY_CARE_PROVIDER_SITE_OTHER): Payer: Self-pay | Admitting: Nurse Practitioner

## 2022-01-11 VITALS — BP 119/80 | HR 103 | Resp 16 | Ht 65.0 in | Wt 167.0 lb

## 2022-01-11 DIAGNOSIS — I83813 Varicose veins of bilateral lower extremities with pain: Secondary | ICD-10-CM

## 2022-01-11 DIAGNOSIS — M79605 Pain in left leg: Secondary | ICD-10-CM | POA: Diagnosis not present

## 2022-01-11 DIAGNOSIS — M79604 Pain in right leg: Secondary | ICD-10-CM

## 2022-01-15 NOTE — Patient Instructions (Incomplete)

## 2022-01-17 ENCOUNTER — Encounter: Payer: Self-pay | Admitting: Gastroenterology

## 2022-01-17 ENCOUNTER — Ambulatory Visit (INDEPENDENT_AMBULATORY_CARE_PROVIDER_SITE_OTHER): Payer: Medicaid Other | Admitting: Gastroenterology

## 2022-01-17 VITALS — BP 113/77 | HR 85 | Temp 98.2°F | Wt 165.0 lb

## 2022-01-17 DIAGNOSIS — K219 Gastro-esophageal reflux disease without esophagitis: Secondary | ICD-10-CM

## 2022-01-17 MED ORDER — PANTOPRAZOLE SODIUM 40 MG PO TBEC: 40.0000 mg | DELAYED_RELEASE_TABLET | Freq: Every day | ORAL | 5 refills | Status: DC

## 2022-01-17 MED ORDER — NA SULFATE-K SULFATE-MG SULF 17.5-3.13-1.6 GM/177ML PO SOLN: 1.0000 | Freq: Once | ORAL | 0 refills | Status: AC

## 2022-01-17 NOTE — Progress Notes (Signed)
? ? ?Primary Care Physician: Venita Lick, NP ? ?Primary Gastroenterologist:  Dr. Lucilla Lame ? ?Chief Complaint  ?Patient presents with  ? Bloated  ?  Pt reports 2-3 Normal BM daily... Denies blood in stools or N/V ?  ? Hiccups  ? Gastroesophageal Reflux  ?  increase in burning worse in he AM, stopped Omeprazole because it was not helping   ? ? ?HPI: Brittney Hampton is a 56 y.o. female here who is seen me in the past for fatty liver disease.  The patient now comes with a history of abdominal pain.  She states that she has a lot of nausea in the morning.  She has a history of taking omeprazole but stopped because it wasn't helping but was on a very low dose.  She also reports bloating.   The patient is not sure if she lost any weight but she states that her clothes are  feeling bigger on her. She also reports that her symptoms have gotten worse.  The patient was 5 pounds heavier back in November of last year.  There is no report of any black stools or bloody stools. The patient also reports that her abdominal pain is mostly diffusely throughout her abdomen and is a dull constant pain and sometimes feels like a poking pain. ? ?Past Medical History:  ?Diagnosis Date  ? Fatty liver   ? GERD (gastroesophageal reflux disease)   ? History of kidney stones   ? Hyperlipidemia   ? Migraine headache   ? fewer recently  ? Motion sickness   ? boats  ? Varicose vein of leg   ? Wears dentures   ? partial upper and lower  ? ? ?Current Outpatient Medications  ?Medication Sig Dispense Refill  ? amitriptyline (ELAVIL) 25 MG tablet Take 2 tablets (50 mg total) by mouth at bedtime. 60 tablet 5  ? celecoxib (CELEBREX) 100 MG capsule Take 100 mg by mouth 2 (two) times daily.    ? Cholecalciferol 1.25 MG (50000 UT) TABS Take 1 tablet by mouth once a week. 12 tablet 4  ? gabapentin (NEURONTIN) 600 MG tablet Take 0.5 tablets (300 mg total) by mouth at bedtime. 90 tablet 1  ? meloxicam (MOBIC) 7.5 MG tablet Take 1 tablet (7.5 mg  total) by mouth daily. 90 tablet 4  ? Na Sulfate-K Sulfate-Mg Sulf 17.5-3.13-1.6 GM/177ML SOLN Take 1 kit by mouth once for 1 dose. 354 mL 0  ? pantoprazole (PROTONIX) 40 MG tablet Take 1 tablet (40 mg total) by mouth daily before supper. 30 tablet 5  ? rizatriptan (MAXALT) 5 MG tablet Take 1 tablet (5 mg total) by mouth as needed for migraine. May repeat in 2 hours if needed 10 tablet 0  ? vitamin B-12 (CYANOCOBALAMIN) 1000 MCG tablet Take 1 tablet (1,000 mcg total) by mouth daily. 90 tablet 4  ? ?Current Facility-Administered Medications  ?Medication Dose Route Frequency Provider Last Rate Last Admin  ? cyanocobalamin ((VITAMIN B-12)) injection 1,000 mcg  1,000 mcg Intramuscular Q30 days Marnee Guarneri T, NP   1,000 mcg at 10/24/21 8756  ? ? ?Allergies as of 01/17/2022  ? (No Known Allergies)  ? ? ?ROS: ? ?General: Negative for anorexia, weight loss, fever, chills, fatigue, weakness. ?ENT: Negative for hoarseness, difficulty swallowing , nasal congestion. ?CV: Negative for chest pain, angina, palpitations, dyspnea on exertion, peripheral edema.  ?Respiratory: Negative for dyspnea at rest, dyspnea on exertion, cough, sputum, wheezing.  ?GI: See history of present illness. ?GU:  Negative for dysuria, hematuria, urinary incontinence, urinary frequency, nocturnal urination.  ?Endo: Negative for unusual weight change.  ?  ?Physical Examination: ? ? BP 113/77   Pulse 85   Temp 98.2 ?F (36.8 ?C) (Oral)   Wt 165 lb (74.8 kg)   LMP 12/06/2016 (Approximate)   BMI 27.46 kg/m?  ? ?General: Well-nourished, well-developed in no acute distress.  ?Eyes: No icterus. Conjunctivae pink. ?Lungs: Clear to auscultation bilaterally. Non-labored. ?Heart: Regular rate and rhythm, no murmurs rubs or gallops.  ?Abdomen: Bowel sounds are normal, nontender, nondistended, no hepatosplenomegaly or masses, no abdominal bruits or hernia , no rebound or guarding.   ?Extremities: No lower extremity edema. No clubbing or deformities. ?Neuro:  Alert and oriented x 3.  Grossly intact. ?Skin: Warm and dry, no jaundice.   ?Psych: Alert and cooperative, normal mood and affect. ? ?Labs:  ?  ?Imaging Studies: ?VAS [place] LOWER EXTREMITY VENOUS REFLUX ? ?Result Date: 01/12/2022 ? Lower Venous Reflux Study Patient Name:  Brittney Hampton  Date of Exam:   01/11/2022 Medical Rec #: [id number]            Accession #:    [id number] Date of Birth: [birth year]            Patient Gender: F Patient Age:   14 years Exam Location:  [facility] Procedure:      VAS [place] LOWER EXTREMITY VENOUS REFLUX Referring Phys: Eulogio Ditch --------------------------------------------------------------------------------  Indications: Pain.  Performing Technologist: Concha Norway RVT  Examination Guidelines: A complete evaluation includes B-mode imaging, spectral Doppler, color Doppler, and power Doppler as needed of all accessible portions of each vessel. Bilateral testing is considered an integral part of a complete examination. Limited examinations for reoccurring indications may be performed as noted. The reflux portion of the exam is performed with the patient in reverse Trendelenburg. Significant venous reflux is defined as >500 ms in the superficial venous system, and >1 second in the deep venous system.   Summary: Bilateral: - No evidence of deep vein thrombosis seen in the lower extremities, bilaterally, from the common femoral through the popliteal veins. - No evidence of superficial venous thrombosis in the lower extremities, bilaterally. - No evidence of deep venous insufficiency seen bilaterally in the lower extremity. - No evidence of superficial venous reflux seen in the greater saphenous veins bilaterally. - No evidence of superficial venous reflux seen in the short saphenous veins bilaterally.  Comparisons: Bilateral PTA's triphasic.  *See table(s) above for measurements and observations. Electronically signed by Leotis Pain MD on 01/12/2022 at 3:11:08 PM.    Final     ? ?Assessment and Plan:  ? ?[name] is a 56 y.o. y/o female who comes in today with morning time nausea and abdominal discomfort with 20 mg of omeprazole not helping her. She was taking the medication in the morning. The patient will be set up for a EGD and colonoscopy for her weight loss abdominal pain and for screening purposes.  The patient will also start taking pantoprazole 40 mg in the evening so that her acid suppression is maximize while she is lying down and thereby may be improving the morning nausea.  The patient has been explained the plan and agrees with it. ?  ? ? ? ?Lucilla Lame, MD. Marval Regal ? ? ? Note: This dictation was prepared with Dragon dictation along with smaller phrase technology. Any transcriptional errors that result from this process are unintentional.  ?

## 2022-01-20 ENCOUNTER — Ambulatory Visit: Payer: Medicaid Other | Admitting: Nurse Practitioner

## 2022-01-23 ENCOUNTER — Encounter (INDEPENDENT_AMBULATORY_CARE_PROVIDER_SITE_OTHER): Payer: Self-pay | Admitting: Nurse Practitioner

## 2022-01-23 NOTE — Progress Notes (Signed)
? ?Subjective:  ? ? Patient ID: Brittney Hampton, female    DOB: October 11, 1965, 56 y.o.   MRN: [id number] ?No chief complaint on file. ? ? ?The patient is a 56 year old female that presents today as a referral from her primary care provider in regards to lower extremity pain.  She notes that her legs feel heavy and and her big toe is numb bilaterally most days.  She notes that she has no pain when she is walking.  However the pain becomes worse when she is sitting or standing.  The longer that she stands the more her legs feel numb and painful.  This has been ongoing for 3 years and it comes and goes.  She does note that the right-sided pain goes from her booster dose in the morning and she notes that she has begun experiencing some of this pain in the left lower extremity.  She denies any rest pain like symptoms.  There are no open wounds or ulcerations. ? ?Today noninvasive studies show no evidence of deep vein thrombosis or superficial thrombophlebitis bilaterally.  No evidence of deep venous insufficiency bilaterally or superficial venous reflux bilaterally. ? ? ?Review of Systems  ?Musculoskeletal:  Positive for gait problem and myalgias.  ?All other systems reviewed and are negative. ? ?   ?Objective:  ? Physical Exam ?Vitals reviewed.  ?HENT:  ?   Head: Normocephalic.  ?Cardiovascular:  ?   Rate and Rhythm: Normal rate.  ?   Pulses:     ?     Dorsalis pedis pulses are 2+ on the right side and 2+ on the left side.  ?     Posterior tibial pulses are 2+ on the right side and 2+ on the left side.  ?Pulmonary:  ?   Effort: Pulmonary effort is normal.  ?Skin: ?   General: Skin is warm and dry.  ?Neurological:  ?   Mental Status: She is alert and oriented to person, place, and time.  ?Psychiatric:     ?   Mood and Affect: Mood normal.     ?   Behavior: Behavior normal.     ?   Thought Content: Thought content normal.     ?   Judgment: Judgment normal.  ? ? ?BP 119/80 (BP Location: Right Arm)   Pulse (!) 103   Resp  16   Ht '5\' 5"'$  (1.651 m)   Wt 167 lb (75.8 kg)   LMP 12/06/2016 (Approximate)   BMI 27.79 kg/m?  ? ?Past Medical History:  ?Diagnosis Date  ? Fatty liver   ? GERD (gastroesophageal reflux disease)   ? History of kidney stones   ? Hyperlipidemia   ? Migraine headache   ? fewer recently  ? Motion sickness   ? boats  ? Varicose vein of leg   ? Wears dentures   ? partial upper and lower  ? ? ?Social History  ? ?Socioeconomic History  ? Marital status: Married  ?  Spouse name: Not on file  ? Number of children: Not on file  ? Years of education: Not on file  ? Highest education level: Not on file  ?Occupational History  ? Not on file  ?Tobacco Use  ? Smoking status: Never  ? Smokeless tobacco: Never  ?Vaping Use  ? Vaping Use: Never used  ?Substance and Sexual Activity  ? Alcohol use: Never  ? Drug use: Never  ? Sexual activity: Yes  ?Other Topics Concern  ? Not on file  ?  Social History Narrative  ? ** Merged History Encounter **  ?    ? ?Social Determinants of Health  ? ?Financial Resource Strain: Medium Risk  ? Difficulty of Paying Living Expenses: Somewhat hard  ?Food Insecurity: No Food Insecurity  ? Worried About Charity fundraiser in the Last Year: Never true  ? Ran Out of Food in the Last Year: Never true  ?Transportation Needs: Unmet Transportation Needs  ? Lack of Transportation (Medical): Yes  ? Lack of Transportation (Non-Medical): Yes  ?Physical Activity: Insufficiently Active  ? Days of Exercise per Week: 2 days  ? Minutes of Exercise per Session: 20 min  ?Stress: Stress Concern Present  ? Feeling of Stress : To some extent  ?Social Connections: Moderately Isolated  ? Frequency of Communication with Friends and Family: More than three times a week  ? Frequency of Social Gatherings with Friends and Family: More than three times a week  ? Attends Religious Services: Never  ? Active Member of Clubs or Organizations: No  ? Attends Archivist Meetings: Never  ? Marital Status: Married  ?Intimate  Partner Violence: Not At Risk  ? Fear of Current or Ex-Partner: No  ? Emotionally Abused: No  ? Physically Abused: No  ? Sexually Abused: No  ? ? ?Past Surgical History:  ?Procedure Laterality Date  ? ABDOMINAL SURGERY    ? CESAREAN SECTION    ? ? ?Family History  ?Problem Relation Age of Onset  ? Osteoarthritis Mother   ? Diabetes Father   ? Cancer Brother   ? ? ?No Known Allergies ? ? ?  Latest Ref Rng & Units 12/05/2021  ? 12:04 PM 10/24/2021  ?  8:48 AM 08/16/2021  ? 11:18 AM  ?CBC  ?WBC 3.4 - 10.8 x10E3/uL 4.6   3.3   3.5    ?Hemoglobin 11.1 - 15.9 g/dL 14.1   14.6   15.6    ?Hematocrit 34.0 - 46.6 % 43.0   43.0   46.3    ?Platelets 150 - 450 x10E3/uL 245   232   247    ? ? ? ? ?CMP  ?   ?Component Value Date/Time  ? NA 143 10/24/2021 0848  ? K 4.4 10/24/2021 0848  ? CL 106 10/24/2021 0848  ? CO2 21 10/24/2021 0848  ? GLUCOSE 98 10/24/2021 0848  ? GLUCOSE 127 (H) 12/03/2020 1312  ? BUN 15 10/24/2021 0848  ? CREATININE 0.66 10/24/2021 0848  ? CALCIUM 9.3 10/24/2021 0848  ? PROT 6.6 10/24/2021 0848  ? ALBUMIN 4.3 10/24/2021 0848  ? AST 19 10/24/2021 0848  ? ALT 17 10/24/2021 0848  ? ALKPHOS 106 10/24/2021 0848  ? BILITOT 0.4 10/24/2021 0848  ? GFRNONAA >60 12/03/2020 1312  ? ? ? ?No results found. ? ?   ?Assessment & Plan:  ? ?1. Varicose veins of both lower extremities with pain ?Today noninvasive studies show no evidence of deep venous insufficiency or superficial venous reflux.  Based on this the patient's current veins are more so cosmetic in nature and not the larger cause of the pain discomfort that she is currently experiencing.  Patient should follow-up with PCP as advised below. ?- VAS [place] LOWER EXTREMITY VENOUS REFLUX ? ?2. Pain in both lower extremities ?Recommend: ? ?The patient has atypical pain symptoms for vascular disease and on exam I do not find evidence of vascular pathology that would explain the patient's symptoms.  Noninvasive studies do not identify significant vascular problems ? ?I  suspect  the patient is c/o pseudoclaudication.  Patient should have an evaluation of the LS spine which I defer to the primary service or the Spine service. ? ?The patient should continue walking and begin a more formal exercise program. ?The patient should continue his antiplatelet therapy and aggressive treatment of the lipid abnormalities. ? ?Patient will follow-up with me on a PRN basis.  ? ? ?Current Outpatient Medications on File Prior to Visit  ?Medication Sig Dispense Refill  ? Cholecalciferol 1.25 MG (50000 UT) TABS Take 1 tablet by mouth once a week. 12 tablet 4  ? vitamin B-12 (CYANOCOBALAMIN) 1000 MCG tablet Take 1 tablet (1,000 mcg total) by mouth daily. 90 tablet 4  ? amitriptyline (ELAVIL) 25 MG tablet Take 2 tablets (50 mg total) by mouth at bedtime. 60 tablet 5  ? celecoxib (CELEBREX) 100 MG capsule Take 100 mg by mouth 2 (two) times daily.    ? gabapentin (NEURONTIN) 600 MG tablet Take 0.5 tablets (300 mg total) by mouth at bedtime. 90 tablet 1  ? meloxicam (MOBIC) 7.5 MG tablet Take 1 tablet (7.5 mg total) by mouth daily. 90 tablet 4  ? rizatriptan (MAXALT) 5 MG tablet Take 1 tablet (5 mg total) by mouth as needed for migraine. May repeat in 2 hours if needed 10 tablet 0  ? ?Current Facility-Administered Medications on File Prior to Visit  ?Medication Dose Route Frequency Provider Last Rate Last Admin  ? cyanocobalamin ((VITAMIN B-12)) injection 1,000 mcg  1,000 mcg Intramuscular Q30 days Marnee Guarneri T, NP   1,000 mcg at 10/24/21 9794  ? ? ?There are no Patient Instructions on file for this visit. ?No follow-ups on file. ? ? ?Kris Hartmann, NP ? ? ?

## 2022-01-24 ENCOUNTER — Ambulatory Visit: Payer: Self-pay | Admitting: *Deleted

## 2022-01-24 NOTE — Telephone Encounter (Signed)
Summary: Swelling in stomach  ? Pt is experiencing swelling in her stomach after she eats [phone]  ?  ? ?Attempted to contact patient via interpreter Angelina ID # [phone] to review sx swelling in stomach. No answer, voicemail box full. Unable to leave message at this time.  ?

## 2022-01-24 NOTE — Telephone Encounter (Signed)
Summary: Swelling in stomach  ? Pt is experiencing swelling in her stomach after she eats [phone]   ?  ? ?Interpreter Vicente Males ID # [phone] called patient .  ? ?Chief Complaint: requesting appt for upper abdominal pain and bloating ?Symptoms: upper abdominal cramping constant, feels like "hot flashes" at times. Has been seen for this in the past. C/o fatigue, lack of energy, rash widespread and itching. Dry cough, throat itching, eyes dry and red. Reports sx noted after taking B 12 and vit D.  ?Frequency: x 1  month  ?Pertinent Negatives: Patient denies chest pain difficulty breathing no fever.  ?Disposition: '[]'$ ED /'[]'$ Urgent Care (no appt availability in office) / '[x]'$ Appointment(In office/virtual)/ '[]'$  [facility]/ '[]'$ Home Care/ '[]'$ Refused Recommended Disposition /'[]'$ [facility]/ '[]'$  Follow-up with PCP ?Additional Notes:  ? ?Appt 01/25/22  ? ? ? ? ?Reason for Disposition ? [1] MODERATE pain (e.g., interferes with normal activities) AND [2] comes and goes (cramps) AND [3] present > 24 hours  (Exception: pain with Vomiting or Diarrhea - see that Guideline) ? ?Answer Assessment - Initial Assessment Questions ?1. LOCATION: "Where does it hurt?"  ?    Above belly button  ?2. RADIATION: "Does the pain shoot anywhere else?" (e.g., chest, back) ?    Back and around right lung area and waist  ?3. ONSET: "When did the pain begin?" (e.g., minutes, hours or days ago)  ?    1 month ago  ?4. SUDDEN: "Gradual or sudden onset?" ?    na ?5. PATTERN "Does the pain come and go, or is it constant?" ?   - If constant: "Is it getting better, staying the same, or worsening?"  ?    (Note: Constant means the pain never goes away completely; most serious pain is constant and it progresses)  ?   - If intermittent: "How long does it last?" "Do you have pain now?" ?    (Note: Intermittent means the pain goes away completely between bouts) ?    Constant , right side pain in lung  ?6. SEVERITY: "How bad is the pain?"  (e.g.,  Scale 1-10; mild, moderate, or severe) ?   - MILD (1-3): doesn't interfere with normal activities, abdomen soft and not tender to touch  ?   - MODERATE (4-7): interferes with normal activities or awakens from sleep, abdomen tender to touch  ?   - SEVERE (8-10): excruciating pain, doubled over, unable to do any normal activities   ?    Difficulty standing due to pain when cramping noted  ?7. RECURRENT SYMPTOM: "Have you ever had this type of stomach pain before?" If Yes, ask: "When was the last time?" and "What happened that time?"  ?    Yes has been seen by PCP and specialist  ?8. AGGRAVATING FACTORS: "Does anything seem to cause this pain?" (e.g., foods, stress, alcohol) ?    Na ?9. CARDIAC SYMPTOMS: "Do you have any of the following symptoms: chest pain, difficulty breathing, sweating, nausea?" ?    Right chest pain ,  ?10. OTHER SYMPTOMS: "Do you have any other symptoms?" (e.g., back pain, diarrhea, fever, urination pain, vomiting) ?      Fatigue, lack of energy, rash all over, itching , dry cough, throat itching ,eyes dry and red . Feels like "hot flashes " at times. Upper abdominal cramping  ?11. PREGNANCY: "Is there any chance you are pregnant?" "When was your last menstrual period?" ?      na ? ?Protocols used:  Abdominal Pain - Upper-A-AH ? ?

## 2022-01-25 ENCOUNTER — Ambulatory Visit
Admission: RE | Admit: 2022-01-25 | Discharge: 2022-01-25 | Disposition: A | Discharge status: Home / Self Care | Payer: Medicaid Other | Source: Ambulatory Visit | Attending: Nurse Practitioner | Admitting: Nurse Practitioner

## 2022-01-25 ENCOUNTER — Ambulatory Visit
Admission: RE | Admit: 2022-01-25 | Discharge: 2022-01-25 | Disposition: A | Discharge status: Home / Self Care | Payer: Medicaid Other | Attending: Nurse Practitioner | Admitting: Nurse Practitioner

## 2022-01-25 ENCOUNTER — Encounter: Payer: Self-pay | Admitting: Nurse Practitioner

## 2022-01-25 ENCOUNTER — Ambulatory Visit: Payer: Medicaid Other | Admitting: Nurse Practitioner

## 2022-01-25 VITALS — BP 92/59 | HR 64 | Temp 98.2°F | Ht 65.0 in | Wt 168.4 lb

## 2022-01-25 DIAGNOSIS — R911 Solitary pulmonary nodule: Secondary | ICD-10-CM

## 2022-01-25 DIAGNOSIS — M2559 Pain in other specified joint: Secondary | ICD-10-CM

## 2022-01-25 DIAGNOSIS — R5383 Other fatigue: Secondary | ICD-10-CM | POA: Diagnosis present

## 2022-01-25 DIAGNOSIS — R1084 Generalized abdominal pain: Secondary | ICD-10-CM | POA: Diagnosis not present

## 2022-01-25 DIAGNOSIS — R768 Other specified abnormal immunological findings in serum: Secondary | ICD-10-CM

## 2022-01-25 DIAGNOSIS — K219 Gastro-esophageal reflux disease without esophagitis: Secondary | ICD-10-CM

## 2022-01-25 DIAGNOSIS — R918 Other nonspecific abnormal finding of lung field: Secondary | ICD-10-CM

## 2022-01-25 DIAGNOSIS — R7689 Other specified abnormal immunological findings in serum: Secondary | ICD-10-CM

## 2022-01-25 DIAGNOSIS — E538 Deficiency of other specified B group vitamins: Secondary | ICD-10-CM | POA: Diagnosis not present

## 2022-01-25 DIAGNOSIS — E559 Vitamin D deficiency, unspecified: Secondary | ICD-10-CM | POA: Diagnosis not present

## 2022-01-25 NOTE — Assessment & Plan Note (Addendum)
Ongoing with poor control.  Continue Protonix 40 MG daily per GI and ensure to schedule EGD/colonoscopy.  Recommend heavy focus on diet changes.  Recheck CBC and CMP today. ?

## 2022-01-25 NOTE — Assessment & Plan Note (Signed)
Chronic, ongoing.  Continue monthly injections in office. Check level today. ?

## 2022-01-25 NOTE — Patient Instructions (Signed)
DR. Posey Pronto == Call to schedule = [phone]  ? ?Abdominal Pain, Adult ?Many things can cause belly (abdominal) pain. Most times, belly pain is not dangerous. Many cases of belly pain can be watched and treated at home. Sometimes, though, belly pain is serious. Your doctor will try to find the cause of your belly pain. ?Follow these instructions at home: ? ?Medicines ?Take over-the-counter and prescription medicines only as told by your doctor. ?Do not take medicines that help you poop (laxatives) unless told by your doctor. ?General instructions ?Watch your belly pain for any changes. ?Drink enough fluid to keep your pee (urine) pale yellow. ?Keep all follow-up visits as told by your doctor. This is important. ?Contact a doctor if: ?Your belly pain changes or gets worse. ?You are not hungry, or you lose weight without trying. ?You are having trouble pooping (constipated) or have watery poop (diarrhea) for more than 2-3 days. ?You have pain when you pee or poop. ?Your belly pain wakes you up at night. ?Your pain gets worse with meals, after eating, or with certain foods. ?You are vomiting and cannot keep anything down. ?You have a fever. ?You have blood in your pee. ?Get help right away if: ?Your pain does not go away as soon as your doctor says it should. ?You cannot stop vomiting. ?Your pain is only in areas of your belly, such as the right side or the left lower part of the belly. ?You have bloody or black poop, or poop that looks like tar. ?You have very bad pain, cramping, or bloating in your belly. ?You have signs of not having enough fluid or water in your body (dehydration), such as: ?Dark pee, very little pee, or no pee. ?Cracked lips. ?Dry mouth. ?Sunken eyes. ?Sleepiness. ?Weakness. ?You have trouble breathing or chest pain. ?Summary ?Many cases of belly pain can be watched and treated at home. ?Watch your belly pain for any changes. ?Take over-the-counter and prescription medicines only as told by your  doctor. ?Contact a doctor if your belly pain changes or gets worse. ?Get help right away if you have very bad pain, cramping, or bloating in your belly. ?This information is not intended to replace advice given to you by your health care provider. Make sure you discuss any questions you have with your health care provider. ?Document Revised: 01/06/2019 Document Reviewed: 01/06/2019 ?Elsevier Patient Education ? [place]. ? ?

## 2022-01-25 NOTE — Assessment & Plan Note (Signed)
Recommend she return to rheumatology for follow-up as they recommended, provided her with number.  Is having increased symptoms. ?

## 2022-01-25 NOTE — Assessment & Plan Note (Signed)
Chronic, ongoing.  Recommend continue supplement weekly, recheck level today. ?

## 2022-01-25 NOTE — Progress Notes (Signed)
? ?BP (!) 92/59   Pulse 64   Temp 98.2 ?F (36.8 ?C) (Oral)   Ht '5\' 5"'$  (1.651 m)   Wt 168 lb 6.4 oz (76.4 kg)   LMP 12/06/2016 (Approximate)   SpO2 95%   BMI 28.02 kg/m?   ? ?Subjective:  ? ? Patient ID: Brittney Hampton, female    DOB: 30-Sep-1965, 56 y.o.   MRN: [id number] ? ?HPI: ?Brittney Hampton is a 56 y.o. female ? ?Chief Complaint  ?Patient presents with  ? GI Problem  ?  Patient states she is having upper abdominal pain. Patient says is still having issues with Gastritis. Patient says when she wakes up in the morning she has cramping, burning sensation and itching in the area. Patient says the medication isn't working much. Patient has yet to follow up with the Gastroenterology specialist.   ? Neck Pain  ?  Patient says the pain has been an ongoing issues and she thinks that it may have been due to being work related. Patient states she use to stand a lot and now she is sitting down and notices pain in her rib area.   ? Skin Problem  ?  Patient states she has been having skin irritations that comes and goes. Patient says she does not take anything because the the reaction does not last long.   ? Fatigue  ?  Patient would like to discuss having lab work as she feels her energy is low.   ? ?ABDOMINAL PAIN  ?Seen by GI recently for this on 01/17/22, she is to have EGD and colonoscopy + he increased her Protonix to taking 40 MG in the evening.  She feels this is similar to heart burn, but is not what it is -- reports it has a burning sensation and itching.  Is having stomach pain, burning sensation, and stabbing pain in abdomen.  Does pass bowels daily without issue, no straining.   ?Duration:months ?Onset: gradual ?Severity: 4/10 ?Quality: cramping and throbbing ?Location:  diffuse  ?Episode duration:  ?Radiation: no ?Frequency: intermittent ?Alleviating factors: nothing ?Aggravating factors: unknown ?Status: fluctuating ?Treatments attempted: PPI ?Fever: no ?Nausea: yes in the morning and some phlegm  with sour taste ?Vomiting: no ?Weight loss: no ?Decreased appetite:  varies ?Diarrhea: no ?Constipation: no ?Blood in stool: no ?Heartburn: yes ?Jaundice: no ?Rash:  reports having skin irritation that comes and goes , this lasts for 2 hours and goes away, then comes back ?Dysuria/urinary frequency: no ?Hematuria: no ?History of sexually transmitted disease: no ?Recurrent NSAID use: no  ? ?JOINT ISSUES & FATIGUE ?History of positive ANA (ENA RNP Ab), was seen by rheumatology on 08/30/21, they started her on Celebrex -- was to return to see them in April 2023, has not as of yet.  Her symptoms are increasing, reports dry eyes and increased fatigue.  Was seen by vascular for pseudoclaudication recently, no vascular findings, they recommended imaging of lumbar spine.  Has had a lot of losses over past year - husband and brother.   Did see a therapist at one time after losses, but reports she is not depressed.   ? ?She would like a chest x-ray as a doctor in [place] told her one time she had a spot in her lungs. Having some shortness of breath with exertion.  ? ?Last labs noted improvement in B12 level, is taking supplement daily.  Vitamin D low, 8.5, started supplement. ?Fever: no ?Cough: no ?Shortness of breath: with exertion ?Wheezing: no ?Chest pain:  no ?Chest tightness: no ?Chest congestion: no ?Fatigue: no ? ?Relevant past medical, surgical, family and social history reviewed and updated as indicated. Interim medical history since our last visit reviewed. ?Allergies and medications reviewed and updated. ? ?Review of Systems  ?Constitutional:  Positive for fatigue. Negative for activity change, appetite change, diaphoresis and fever.  ?Respiratory:  Positive for shortness of breath. Negative for cough, chest tightness and wheezing.   ?Cardiovascular:  Negative for chest pain, palpitations and leg swelling.  ?Gastrointestinal:  Positive for abdominal pain and nausea. Negative for abdominal distention, constipation,  diarrhea and vomiting.  ?Musculoskeletal:  Positive for arthralgias.  ?Neurological: Negative.   ?Psychiatric/Behavioral: Negative.    ? ?Per HPI unless specifically indicated above ? ?   ?Objective:  ?  ?BP (!) 92/59   Pulse 64   Temp 98.2 ?F (36.8 ?C) (Oral)   Ht '5\' 5"'$  (1.651 m)   Wt 168 lb 6.4 oz (76.4 kg)   LMP 12/06/2016 (Approximate)   SpO2 95%   BMI 28.02 kg/m?   ?Wt Readings from Last 3 Encounters:  ?01/25/22 168 lb 6.4 oz (76.4 kg)  ?01/17/22 165 lb (74.8 kg)  ?01/11/22 167 lb (75.8 kg)  ?  ?Physical Exam ?Vitals and nursing note reviewed.  ?Constitutional:   ?   General: She is awake. She is not in acute distress. ?   Appearance: She is well-developed and well-groomed. She is not ill-appearing or toxic-appearing.  ?HENT:  ?   Head: Normocephalic.  ?   Right Ear: Hearing normal.  ?   Left Ear: Hearing normal.  ?Eyes:  ?   General: Lids are normal.     ?   Right eye: No discharge.     ?   Left eye: No discharge.  ?   Conjunctiva/sclera: Conjunctivae normal.  ?   Pupils: Pupils are equal, round, and reactive to light.  ?Neck:  ?   Thyroid: No thyromegaly.  ?   Vascular: No carotid bruit.  ?Cardiovascular:  ?   Rate and Rhythm: Normal rate and regular rhythm.  ?   Pulses:     ?     Dorsalis pedis pulses are 2+ on the right side and 2+ on the left side.  ?     Posterior tibial pulses are 2+ on the right side and 2+ on the left side.  ?   Heart sounds: Normal heart sounds. No murmur heard. ?  No gallop.  ?Pulmonary:  ?   Effort: Pulmonary effort is normal. No accessory muscle usage or respiratory distress.  ?   Breath sounds: Normal breath sounds.  ?Abdominal:  ?   General: Bowel sounds are normal. There is no distension.  ?   Palpations: Abdomen is soft.  ?   Tenderness: There is no abdominal tenderness.  ?Musculoskeletal:  ?   Cervical back: Normal range of motion and neck supple. No rigidity or crepitus. No muscular tenderness. Normal range of motion.  ?   Thoracic back: Normal.  ?   Lumbar back:  Normal.  ?   Right lower leg: No edema.  ?   Left lower leg: No edema.  ?Lymphadenopathy:  ?   Cervical: No cervical adenopathy.  ?Skin: ?   General: Skin is warm and dry.  ?Neurological:  ?   Mental Status: She is alert and oriented to person, place, and time.  ?   Sensory: Sensation is intact.  ?   Motor: Motor function is intact.  ?   Coordination:  Coordination is intact.  ?   Gait: Gait is intact.  ?   Deep Tendon Reflexes: Reflexes are normal and symmetric.  ?   Reflex Scores: ?     Brachioradialis reflexes are 2+ on the right side and 2+ on the left side. ?     Patellar reflexes are 2+ on the right side and 2+ on the left side. ?Psychiatric:     ?   Attention and Perception: Attention normal.     ?   Mood and Affect: Mood normal.     ?   Behavior: Behavior is cooperative.     ?   Thought Content: Thought content normal.     ?   Judgment: Judgment normal.  ? ? ?Results for orders placed or performed in visit on 12/05/21  ?CBC with Differential/Platelet  ?Result Value Ref Range  ? WBC 4.6 3.4 - 10.8 x10E3/uL  ? RBC 4.66 3.77 - 5.28 x10E6/uL  ? Hemoglobin 14.1 11.1 - 15.9 g/dL  ? Hematocrit 43.0 34.0 - 46.6 %  ? MCV 92 79 - 97 fL  ? MCH 30.3 26.6 - 33.0 pg  ? MCHC 32.8 31.5 - 35.7 g/dL  ? RDW 13.0 11.7 - 15.4 %  ? Platelets 245 150 - 450 x10E3/uL  ? Neutrophils 48 Not Estab. %  ? Lymphs 40 Not Estab. %  ? Monocytes 7 Not Estab. %  ? Eos 4 Not Estab. %  ? Basos 1 Not Estab. %  ? Neutrophils Absolute 2.2 1.4 - 7.0 x10E3/uL  ? Lymphocytes Absolute 1.9 0.7 - 3.1 x10E3/uL  ? Monocytes Absolute 0.3 0.1 - 0.9 x10E3/uL  ? EOS (ABSOLUTE) 0.2 0.0 - 0.4 x10E3/uL  ? Basophils Absolute 0.1 0.0 - 0.2 x10E3/uL  ? Immature Granulocytes 0 Not Estab. %  ? Immature Grans (Abs) 0.0 0.0 - 0.1 x10E3/uL  ? ?   ?Assessment & Plan:  ? ?Problem List Items Addressed This Visit   ? ?  ? Digestive  ? GERD (gastroesophageal reflux disease) - Primary  ?  Ongoing with poor control.  Continue Protonix 40 MG daily per GI and ensure to schedule  EGD/colonoscopy.  Recommend heavy focus on diet changes.  Recheck CBC and CMP today. ? ?  ?  ? Relevant Medications  ? Na Sulfate-K Sulfate-Mg Sulf 17.5-3.13-1.6 GM/177ML SOLN  ?  ? Other  ? ANA positive  ?  Recommend she r

## 2022-01-25 NOTE — Assessment & Plan Note (Signed)
Ongoing -- recommend she follow-up with rheumatology.  Is having increased symptoms -- obtain imaging lumbar spine, cervical spine, and chest.  Repeat labs today to include CBC, CMP, CRP, CK, ESR, TSH, Free T4, Alpha Gal (as has GI symptoms), and A1c.  ?some symptoms more psychosomatic based on losses recently.  May benefit a trial of Duloxetine in future. ?

## 2022-01-26 DIAGNOSIS — R911 Solitary pulmonary nodule: Secondary | ICD-10-CM | POA: Insufficient documentation

## 2022-01-26 NOTE — Addendum Note (Signed)
Addended by: Marnee Guarneri T on: 01/26/2022 04:36 PM   Modules accepted: Orders

## 2022-01-26 NOTE — Progress Notes (Signed)
Spanish speaking -- please let Brittney Hampton know her labs and chest imaging has returned, waiting on back imaging: - Kidney function, creatinine and eGFR, remains normal, as is liver function, AST and ALT.   - Vitamin D level is still low, but improving, continue weekly Vitamin D3. - B12 level is on lower side again, are you still getting shots.  I recommend to restart these to maintain this level. - Remainder of labs are all stable, we are still waiting Alpha Gal labs, when these return I will alert you. - Chest x-ray did note a nodule at the right lung base, as the provider in [place] told you.  It is recommended to have a CT of the chest to further assess.  I will order this today and someone will call to schedule this with you.  Any questions? Keep being wonderful!!  Thank you for allowing me to participate in your care.  I appreciate you. Kindest regards, Nadiah Corbit

## 2022-01-26 NOTE — Progress Notes (Signed)
Contacted with lab note.

## 2022-01-27 ENCOUNTER — Encounter: Payer: Self-pay | Admitting: Nurse Practitioner

## 2022-01-27 DIAGNOSIS — M8580 Other specified disorders of bone density and structure, unspecified site: Secondary | ICD-10-CM | POA: Insufficient documentation

## 2022-01-27 NOTE — Progress Notes (Signed)
Spanish speaking -- Please let Brittney Hampton know her imaging has returned.  There is some osteopenia to lower back, thinning of the bone, noted.  This is common after women go through menopause and we will start doing bone density screening via imaging when she turns 68.  For now I recommend she continue her weekly vitamin D supplement.  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  I would recommend a referral to physical therapy for this.  There was also some constipation noted, I recommend taking daily over the counter Miralax for this.  Any questions?

## 2022-01-29 LAB — COMPREHENSIVE METABOLIC PANEL
ALT: 17 IU/L (ref 0–32)
AST: 22 IU/L (ref 0–40)
Albumin/Globulin Ratio: 1.8 (ref 1.2–2.2)
Albumin: 4.2 g/dL (ref 3.8–4.9)
Alkaline Phosphatase: 91 IU/L (ref 44–121)
BUN/Creatinine Ratio: 23 (ref 9–23)
BUN: 15 mg/dL (ref 6–24)
Bilirubin Total: 0.4 mg/dL (ref 0.0–1.2)
CO2: 23 mmol/L (ref 20–29)
Calcium: 9.2 mg/dL (ref 8.7–10.2)
Chloride: 107 mmol/L — ABNORMAL HIGH (ref 96–106)
Creatinine, Ser: 0.66 mg/dL (ref 0.57–1.00)
Globulin, Total: 2.4 g/dL (ref 1.5–4.5)
Glucose: 118 mg/dL — ABNORMAL HIGH (ref 70–99)
Potassium: 3.8 mmol/L (ref 3.5–5.2)
Sodium: 144 mmol/L (ref 134–144)
Total Protein: 6.6 g/dL (ref 6.0–8.5)
eGFR: 104 mL/min/{1.73_m2} (ref >59)

## 2022-01-29 LAB — ALPHA-GAL PANEL
Allergen Lamb IgE: <0.1 kU/L
Beef IgE: <0.1 kU/L
IgE (Immunoglobulin E), Serum: 52 IU/mL (ref 6–495)
O215-IgE Alpha-Gal: <0.1 kU/L
Pork IgE: <0.1 kU/L

## 2022-01-29 LAB — CBC WITH DIFFERENTIAL/PLATELET
Basophils Absolute: 0 10*3/uL (ref 0.0–0.2)
Basos: 1 %
EOS (ABSOLUTE): 0.1 10*3/uL (ref 0.0–0.4)
Eos: 4 %
Hematocrit: 43.9 % (ref 34.0–46.6)
Hemoglobin: 14.6 g/dL (ref 11.1–15.9)
Immature Grans (Abs): 0 10*3/uL (ref 0.0–0.1)
Immature Granulocytes: 0 %
Lymphocytes Absolute: 1.5 10*3/uL (ref 0.7–3.1)
Lymphs: 36 %
MCH: 30.3 pg (ref 26.6–33.0)
MCHC: 33.3 g/dL (ref 31.5–35.7)
MCV: 91 fL (ref 79–97)
Monocytes Absolute: 0.3 10*3/uL (ref 0.1–0.9)
Monocytes: 7 %
Neutrophils Absolute: 2.1 10*3/uL (ref 1.4–7.0)
Neutrophils: 52 %
Platelets: 223 10*3/uL (ref 150–450)
RBC: 4.82 x10E6/uL (ref 3.77–5.28)
RDW: 12.5 % (ref 11.7–15.4)
WBC: 4.1 10*3/uL (ref 3.4–10.8)

## 2022-01-29 LAB — C-REACTIVE PROTEIN: CRP: 2 mg/L (ref 0–10)

## 2022-01-29 LAB — TSH: TSH: 2.45 u[IU]/mL (ref 0.450–4.500)

## 2022-01-29 LAB — HEMOGLOBIN A1C
Est. average glucose Bld gHb Est-mCnc: 111 mg/dL
Hgb A1c MFr Bld: 5.5 % (ref 4.8–5.6)

## 2022-01-29 LAB — SEDIMENTATION RATE: Sed Rate: 11 mm/hr (ref 0–40)

## 2022-01-29 LAB — VITAMIN D 25 HYDROXY (VIT D DEFICIENCY, FRACTURES): Vit D, 25-Hydroxy: 25.8 ng/mL — ABNORMAL LOW (ref 30.0–100.0)

## 2022-01-29 LAB — VITAMIN B12: Vitamin B-12: 241 pg/mL (ref 232–1245)

## 2022-01-29 LAB — CK: Total CK: 175 U/L (ref 32–182)

## 2022-01-29 LAB — T4, FREE: Free T4: 0.98 ng/dL (ref 0.82–1.77)

## 2022-02-02 NOTE — Addendum Note (Signed)
Addended by: Marnee Guarneri T on: 02/02/2022 09:06 AM   Modules accepted: Orders

## 2022-02-15 ENCOUNTER — Ambulatory Visit
Admission: RE | Admit: 2022-02-15 | Discharge: 2022-02-15 | Disposition: A | Discharge status: Home / Self Care | Payer: Medicaid Other | Source: Ambulatory Visit | Attending: Nurse Practitioner | Admitting: Nurse Practitioner

## 2022-02-15 DIAGNOSIS — R911 Solitary pulmonary nodule: Secondary | ICD-10-CM | POA: Diagnosis present

## 2022-02-15 DIAGNOSIS — R918 Other nonspecific abnormal finding of lung field: Secondary | ICD-10-CM | POA: Diagnosis present

## 2022-02-21 ENCOUNTER — Ambulatory Visit (INDEPENDENT_AMBULATORY_CARE_PROVIDER_SITE_OTHER): Payer: Medicaid Other

## 2022-02-21 ENCOUNTER — Other Ambulatory Visit: Payer: Self-pay

## 2022-02-21 DIAGNOSIS — R5383 Other fatigue: Secondary | ICD-10-CM | POA: Diagnosis not present

## 2022-02-21 DIAGNOSIS — K219 Gastro-esophageal reflux disease without esophagitis: Secondary | ICD-10-CM

## 2022-02-21 DIAGNOSIS — Z1211 Encounter for screening for malignant neoplasm of colon: Secondary | ICD-10-CM

## 2022-02-21 LAB — ECHOCARDIOGRAM COMPLETE
AR max vel: 2.15 cm2
AV Area VTI: 2.07 cm2
AV Area mean vel: 1.92 cm2
AV Mean grad: 5 mmHg
AV Peak grad: 7.2 mmHg
Ao pk vel: 1.34 m/s
Area-P 1/2: 3.4 cm2
S' Lateral: 2.6 cm
Single Plane A4C EF: 65.9 %

## 2022-02-21 NOTE — Progress Notes (Signed)
Good morning crew, please let Brittney Hampton know her echo of her heart has returned and overall function is normal with no changes in valve structure and a good heart pump.  No concerns noted on this.  If any questions let me know. Keep being stellar!!  Thank you for allowing me to participate in your care.  I appreciate you. Kindest regards, Kanon Colunga

## 2022-02-25 DIAGNOSIS — M4306 Spondylolysis, lumbar region: Secondary | ICD-10-CM | POA: Insufficient documentation

## 2022-03-01 ENCOUNTER — Ambulatory Visit: Payer: Medicaid Other | Admitting: Nurse Practitioner

## 2022-03-07 ENCOUNTER — Ambulatory Visit: Payer: Self-pay

## 2022-03-08 ENCOUNTER — Ambulatory Visit: Payer: Medicaid Other | Admitting: Unknown Physician Specialty

## 2022-04-10 ENCOUNTER — Telehealth: Payer: Self-pay

## 2022-04-10 NOTE — Telephone Encounter (Signed)
Pt scheduled for EGD w/Colonoscopy with Dr. Allen Norris for tomorrow.  Brittney Hampton stated that patient has been eating solid foods.  Reported she had just ate her lunch.  Trish informed her that she will be contacted by the office to discuss.  Attempted to contact patient to let her know that we will need to reschedule her procedures due to she was supposed to be on a clear liquid diet today.  I was unable to get in touch with her on home or cell due to voice mail is full.  This call was made with the assistance of [employer].    I've rescheduled her to 04/20/22 and will continue to try to get in touch with her to make her aware of the date change.  Thanks,   [place], [place]

## 2022-04-20 ENCOUNTER — Ambulatory Visit: Admission: RE | Admit: 2022-04-20 | Payer: Medicaid Other | Source: Ambulatory Visit | Admitting: Gastroenterology

## 2022-04-20 ENCOUNTER — Encounter: Payer: Self-pay | Admitting: Certified Registered"

## 2022-04-20 ENCOUNTER — Encounter: Admission: RE | Payer: Self-pay | Source: Ambulatory Visit

## 2022-04-20 SURGERY — COLONOSCOPY WITH PROPOFOL
Anesthesia: General

## 2022-05-10 ENCOUNTER — Encounter: Payer: Self-pay | Admitting: Nurse Practitioner

## 2022-05-10 ENCOUNTER — Ambulatory Visit (INDEPENDENT_AMBULATORY_CARE_PROVIDER_SITE_OTHER): Payer: Medicaid Other | Admitting: Nurse Practitioner

## 2022-05-10 VITALS — BP 106/68 | HR 64 | Temp 98.5°F | Ht 65.0 in | Wt 166.4 lb

## 2022-05-10 DIAGNOSIS — B9689 Other specified bacterial agents as the cause of diseases classified elsewhere: Secondary | ICD-10-CM | POA: Diagnosis not present

## 2022-05-10 DIAGNOSIS — N760 Acute vaginitis: Secondary | ICD-10-CM | POA: Diagnosis not present

## 2022-05-10 DIAGNOSIS — R1084 Generalized abdominal pain: Secondary | ICD-10-CM | POA: Diagnosis not present

## 2022-05-10 DIAGNOSIS — R8281 Pyuria: Secondary | ICD-10-CM | POA: Diagnosis not present

## 2022-05-10 LAB — WET PREP FOR TRICH, YEAST, CLUE
Clue Cell Exam: POSITIVE — AB
Trichomonas Exam: NEGATIVE
Yeast Exam: POSITIVE — AB

## 2022-05-10 LAB — URINALYSIS, ROUTINE W REFLEX MICROSCOPIC
Bilirubin, UA: NEGATIVE
Glucose, UA: NEGATIVE
Ketones, UA: NEGATIVE
Nitrite, UA: NEGATIVE
Protein,UA: NEGATIVE
Specific Gravity, UA: 1.02 (ref 1.005–1.030)
Urobilinogen, Ur: 0.2 mg/dL (ref 0.2–1.0)
pH, UA: 8.5 — ABNORMAL HIGH (ref 5.0–7.5)

## 2022-05-10 LAB — MICROSCOPIC EXAMINATION: Bacteria, UA: NONE SEEN

## 2022-05-10 MED ORDER — DICYCLOMINE HCL 10 MG PO CAPS
10.0000 mg | ORAL_CAPSULE | Freq: Three times a day (TID) | ORAL | 5 refills | Status: DC
Start: 2022-05-10 — End: 2022-08-16

## 2022-05-10 MED ORDER — METRONIDAZOLE 500 MG PO TABS: 500.0000 mg | ORAL_TABLET | Freq: Two times a day (BID) | ORAL | 0 refills | Status: AC

## 2022-05-10 MED ORDER — FLUCONAZOLE 150 MG PO TABS: 150.0000 mg | ORAL_TABLET | Freq: Once | ORAL | 0 refills | Status: AC

## 2022-05-10 NOTE — Progress Notes (Signed)
BP 106/68   Pulse 64   Temp 98.5 F (36.9 C) (Oral)   Ht '5\' 5"'$  (1.651 m)   Wt 166 lb 6.4 oz (75.5 kg)   LMP 12/06/2016 (Approximate)   SpO2 96%   BMI 27.69 kg/m    Subjective:    Patient ID: Brittney Hampton, female    DOB: Sep 10, 1966, 56 y.o.   MRN: [id number]  HPI: Brittney Hampton is a 56 y.o. female  Chief Complaint  Patient presents with   Abdominal Pain    Patient is here for Abdominal Pain. Patient says she is having issues with pain in her stomach. Patient says she has not followed with her Gastroenterologist. Patient says she started noticing symptoms about a month. Patient says the pain is constant to where it use to be come and go.    Interpreter at bedside to assist with visit.  ABDOMINAL PAIN  Presents for abdominal pain, started stronger one month ago to upper abdomen.  Seen by GI for this on 01/17/22, she is to have EGD and colonoscopy, but has not obtained (cancelled 04/20/22) -- she has not rescheduled as of yet.  They increased her Protonix to taking 40 MG in the evening -- she reports she stopped taking as had a burning sensation with this.  Recent chest CT on 02/15/22 noted 6 mm solid right middle lobe pulmonary nodule -- recommend f/u in 6-12 months.  She did visit with urology for kidney stones on 03/21/22 (no treatment at this time) and then recent UTI on 05/05/22 seen in [facility] ER and treated with Macrobid -- has not completed this, still taking -- pain did not become worse with taking this.  Labs in ER for liver and pancreas were reassuring.  No culture available. Duration:months Onset: gradual Severity: 5/10 Quality: burning, cramping, and throbbing -- sweats with this Location:  LUQ, RUQ, and epigastric  Episode duration:  Radiation: no Frequency: intermittent Alleviating factors: when eating becomes better Aggravating factors: when does not eat or drinking fluid, worst when wakes up in morning Status: worse Treatments attempted: none Fever:  no Nausea: yes Vomiting: no Weight loss: no Decreased appetite: yes Diarrhea: yes Constipation: yes -- alternates with diarrhea Blood in stool: no Heartburn:  occasional Jaundice: no Rash: no Dysuria/urinary frequency: no Hematuria: no History of sexually transmitted disease: no Recurrent NSAID use: no   Relevant past medical, surgical, family and social history reviewed and updated as indicated. Interim medical history since our last visit reviewed. Allergies and medications reviewed and updated.  Review of Systems  Constitutional:  Negative for activity change, appetite change, diaphoresis, fatigue and fever.  Respiratory:  Negative for cough, chest tightness, shortness of breath and wheezing.   Cardiovascular:  Negative for chest pain, palpitations and leg swelling.  Gastrointestinal:  Positive for abdominal pain. Negative for abdominal distention, constipation, diarrhea, nausea and vomiting.  Neurological: Negative.   Psychiatric/Behavioral: Negative.      Per HPI unless specifically indicated above     Objective:    BP 106/68   Pulse 64   Temp 98.5 F (36.9 C) (Oral)   Ht '5\' 5"'$  (1.651 m)   Wt 166 lb 6.4 oz (75.5 kg)   LMP 12/06/2016 (Approximate)   SpO2 96%   BMI 27.69 kg/m   Wt Readings from Last 3 Encounters:  05/10/22 166 lb 6.4 oz (75.5 kg)  01/25/22 168 lb 6.4 oz (76.4 kg)  01/17/22 165 lb (74.8 kg)    Physical Exam Vitals and nursing  note reviewed.  Constitutional:      General: She is awake. She is not in acute distress.    Appearance: She is well-developed and well-groomed. She is not ill-appearing or toxic-appearing.  HENT:     Head: Normocephalic.     Right Ear: Hearing normal.     Left Ear: Hearing normal.  Eyes:     General: Lids are normal.        Right eye: No discharge.        Left eye: No discharge.     Conjunctiva/sclera: Conjunctivae normal.     Pupils: Pupils are equal, round, and reactive to light.  Neck:     Thyroid: No  thyromegaly.     Vascular: No carotid bruit.  Cardiovascular:     Rate and Rhythm: Normal rate and regular rhythm.     Heart sounds: Normal heart sounds. No murmur heard.    No gallop.  Pulmonary:     Effort: Pulmonary effort is normal. No accessory muscle usage or respiratory distress.     Breath sounds: Normal breath sounds.  Abdominal:     General: Bowel sounds are normal. There is no distension.     Palpations: Abdomen is soft.     Tenderness: There is generalized abdominal tenderness.  Musculoskeletal:     Cervical back: Normal range of motion and neck supple. No muscular tenderness.     Right lower leg: No edema.     Left lower leg: No edema.  Lymphadenopathy:     Cervical: No cervical adenopathy.  Skin:    General: Skin is warm and dry.  Neurological:     Mental Status: She is alert and oriented to person, place, and time.     Sensory: Sensation is intact.     Motor: Motor function is intact.     Coordination: Coordination is intact.     Gait: Gait is intact.     Deep Tendon Reflexes: Reflexes are normal and symmetric.     Reflex Scores:      Brachioradialis reflexes are 2+ on the right side and 2+ on the left side.      Patellar reflexes are 2+ on the right side and 2+ on the left side. Psychiatric:        Attention and Perception: Attention normal.        Mood and Affect: Mood normal.        Behavior: Behavior is cooperative.        Thought Content: Thought content normal.        Judgment: Judgment normal.    Results for orders placed or performed in visit on 05/10/22  WET PREP FOR [place], YEAST, CLUE   Specimen: Sterile Swab   Sterile Swab  Result Value Ref Range   Trichomonas Exam Negative Negative   Yeast Exam Positive (A) Negative   Clue Cell Exam Positive (A) Negative  Microscopic Examination   Urine  Result Value Ref Range   WBC, UA 0-5 0 - 5 /hpf   RBC, Urine 0-2 0 - 2 /hpf   Epithelial Cells (non renal) 0-10 0 - 10 /hpf   Bacteria, UA None seen  None seen/Few  Urinalysis, Routine w reflex microscopic  Result Value Ref Range   Specific Gravity, UA 1.020 1.005 - 1.030   pH, UA 8.5 (H) 5.0 - 7.5   Color, UA Yellow Yellow   Appearance Ur Clear Clear   Leukocytes,UA Trace (A) Negative   Protein,UA Negative Negative/Trace   Glucose,  UA Negative Negative   Ketones, UA Negative Negative   RBC, UA Trace (A) Negative   Bilirubin, UA Negative Negative   Urobilinogen, Ur 0.2 0.2 - 1.0 mg/dL   Nitrite, UA Negative Negative   Microscopic Examination See below:       Assessment & Plan:   Problem List Items Addressed This Visit       Genitourinary   Bacterial vaginosis    Wet prep + for clue cells and yeast.  Script for Flagyl and Diflucan sent in .  Educated her on findings.      Relevant Medications   fluconazole (DIFLUCAN) 150 MG tablet   metroNIDAZOLE (FLAGYL) 500 MG tablet     Other   Generalized abdominal pain - Primary    Ongoing issue, she self stopped PPI and has not scheduled follow-up for GI.  We scheduled this for her today for December.  Currently being treated for UTI, Macrobid -- Urine today overall is improving, but will send for culture due to no culture available from ER.  Wet prep performed and is + clue cells and yeast.  Educated patient on this finding.  Start Flagyl BID for 7 days and Diflucan x 1 dose.  ?reason for current discomfort worsening or related to her ongoing GERD issues.  Return in 4 weeks.  Recheck blood work next visit, recent labs were reassuring in ER.      Relevant Orders   Urinalysis, Routine w reflex microscopic (Completed)   WET PREP FOR [place], YEAST, CLUE (Completed)   Urine Culture   Other Visit Diagnoses     Pyuria       Urine for culture.   Relevant Orders   Urine Culture        Follow up plan: Return in about 4 weeks (around 06/07/2022) for Abdominal Pain.

## 2022-05-10 NOTE — Assessment & Plan Note (Signed)
Wet prep + for clue cells and yeast.  Script for Flagyl and Diflucan sent in .  Educated her on findings.

## 2022-05-10 NOTE — Patient Instructions (Addendum)
Wear compression at home on during day and off at night.  Call Dr. Posey Pronto rheumatology [phone] and schedule.  Vaginosis bacteriana Bacterial Vaginosis  La vaginosis bacteriana es una infeccin de la vagina. Se produce cuando crece una cantidad excesiva de grmenes normales (bacterias sanas) en la vagina. Esta infeccin puede facilitar contraer otras infecciones por las relaciones sexuales (infecciones de transmisin sexual, ITS). Es muy importante que las mujeres embarazadas reciban tratamiento. Esta infeccin puede hacer que los bebs nazcan antes de tiempo o tengan un bajo peso al nacer. Cules son las causas? La causa de esta infeccin es el aumento de ciertas bacterias que crecen en la vagina. No se puede contraer esta infeccin en los asientos de inodoros, en las sbanas, en las piscinas ni en los objetos que entran en contacto con la vagina. Qu incrementa el riesgo? Tener relaciones sexuales con una persona nueva o con ms de Arts administrator. Tener relaciones sexuales sin proteccin. Hacerse duchas vaginales. Tener colocado un dispositivo intrauterino (DIU). Fumar. Consumir drogas o beber alcohol. Esto puede llevarla a hacer cosas que son [place]. Tomar ciertos antibiticos. Estar embarazada. Cules son los signos o sntomas? Algunas mujeres no tienen sntomas. Entre los sntomas, se pueden incluir los siguientes: Secrecin de la vagina. Puede ser gris o blanca. Puede ser acuosa o espumosa. Olor a pescado. Esto puede ocurrir despus de Office manager sexuales o durante el perodo menstrual. Picazn en la vagina y a su alrededor. Sensacin de ardor o dolor al hacer pis (orinar). Cmo se trata? Esta infeccin se trata con antibiticos. Estos pueden administrarse en las siguientes presentaciones: Pastillas. Crema para la vagina. Medicamento que se coloca dentro de la vagina (vulo vaginal). Si la infeccin reaparece despus del tratamiento, es posible que necesite ms  antibiticos. Siga estas instrucciones en su casa: Medicamentos Use los medicamentos de venta libre y los recetados como se lo haya indicado el [place] o use el antibitico como se lo haya indicado el mdico. No deje de tomarlo o usarlo aunque comience a sentirse mejor. Instrucciones generales Si la persona con la que tiene relaciones sexuales es [place], dgale que usted tiene esta infeccin. Ella tendr que concurrir a visitas de control con el mdico. Si tiene una pareja sexual hombre, l no necesita tratamiento. No mantenga relaciones sexuales Database administrator. Beba suficiente lquido como para Theatre manager la orina de color amarillo plido. Mantenga la vagina y el ano limpios. Lave la zona con agua tibia cada da. Cuando vaya al bao, higiencese de adelante [employer]. Si est amamantando a un beb, pregunte al mdico si debera seguir Camera operator. Cumpla con todas las visitas de seguimiento. Cmo se previene? Autocuidado No se haga duchas vaginales. Use nicamente agua tibia para lavar la zona alrededor de la vagina. Use ropa interior de algodn o cuya parte de adentro sea de algodn. No use pantalones ajustados ni pantis; en especial, durante el verano. Sexo seguro Use proteccin al [employer]. Esto puede comprender lo siguiente: Use preservativos. Use barreras bucales. Estas son capas delgadas de ltex que protegen la boca durante el sexo oral. Limite la cantidad de personas con las que tiene Armed forces operational officer. Para prevenir esta infeccin, lo mejor es tener relaciones sexuales con una sola persona. Hgase exmenes de ITS. Tambin debe [employer] la persona con quien tiene Armed forces operational officer. Drogas y alcohol No fume ni consuma ningn producto que contenga nicotina o tabaco. Si necesita ayuda para dejar de consumir estos productos, consulte al mdico. No  consuma drogas. No beba alcohol si: El mdico le indica que  no lo haga. Est embarazada, puede estar embarazada o est tratando de [place]. Si bebe alcohol: Limite la cantidad que consume de 0 a 1 medida por da. Sepa cunta cantidad de alcohol hay en las bebidas que toma. En los [place], una medida equivale a una botella de cerveza de 12 oz (355 ml), un vaso de vino de 5 oz (148 ml) o un vaso de una bebida alcohlica de alta graduacin de 1 oz (44 ml). Dnde buscar ms informacin Centers for Disease Control and Prevention (Centros para el Control y la Prevencin de Arboriculturist): http://www.wolf.info/ American Sexual Health Association (Asociacin Estadounidense de la Salud Sexual): www.ashastd.org Office on [employer] ([place]): LegalWarrants.gl Comunquese con un mdico si: Los sntomas no mejoran, incluso despus de Chiropodist. Tiene ms secrecin o siente dolor al hacer pis. Tiene fiebre o escalofros. Tiene dolor en el vientre (abdomen) o en la zona que se encuentra entre las caderas. Siente dolor durante el sexo. Le sangra la vagina entre perodos menstruales. Resumen Esta infeccin puede ocurrir cuando crecen demasiados grmenes (bacterias) en la vagina. Esta infeccin puede facilitar contraer infecciones por las relaciones sexuales (infecciones de transmisin sexual, ITS). El tratamiento puede disminuir esa posibilidad. Reciba tratamiento si est embarazada. Esta infeccin puede hacer que los bebs nazcan antes de [place]. No deje de tomar ni de usar el antibitico aunque comience a sentirse mejor. Esta informacin no tiene Marine scientist el consejo del mdico. Asegrese de hacerle al mdico cualquier pregunta que tenga. Document Revised: 03/31/2020 Document Reviewed: 03/31/2020 Elsevier Patient Education  [place].

## 2022-05-10 NOTE — Assessment & Plan Note (Signed)
Ongoing issue, she self stopped PPI and has not scheduled follow-up for GI.  We scheduled this for her today for December.  Currently being treated for UTI, Macrobid -- Urine today overall is improving, but will send for culture due to no culture available from ER.  Wet prep performed and is + clue cells and yeast.  Educated patient on this finding.  Start Flagyl BID for 7 days and Diflucan x 1 dose.  ?reason for current discomfort worsening or related to her ongoing GERD issues.  Return in 4 weeks.  Recheck blood work next visit, recent labs were reassuring in ER.

## 2022-05-11 ENCOUNTER — Telehealth: Payer: Self-pay

## 2022-05-11 NOTE — Telephone Encounter (Signed)
Hello Ms. Brittney Hampton, I am going through our reports for the office and see that you are overdue for your mammogram. I would be glad to order and or schedule the mammogram for you if you would like. We typically use [facility] for this. Would you be ok with this? If so please return our call with what days and times of the day that works best for you. Please let me know either way which you would like to do. I hope you have a great day?

## 2022-05-12 LAB — URINE CULTURE: Organism ID, Bacteria: NO GROWTH

## 2022-06-03 NOTE — Patient Instructions (Incomplete)
Biotin -- 1000 MCG daily Vitamin D 2000 units daily Vitamin B12 1000 MCG daily Multivitamin one tablet daily  Abdominal Pain, Adult Many things can cause belly (abdominal) pain. Most times, belly pain is not dangerous. Many cases of belly pain can be watched and treated at home. Sometimes, though, belly pain is serious. Your doctor will try to find the cause of your belly pain. Follow these instructions at home:  Medicines Take over-the-counter and prescription medicines only as told by your doctor. Do not take medicines that help you poop (laxatives) unless told by your doctor. General instructions Watch your belly pain for any changes. Drink enough fluid to keep your pee (urine) pale yellow. Keep all follow-up visits as told by your doctor. This is important. Contact a doctor if: Your belly pain changes or gets worse. You are not hungry, or you lose weight without trying. You are having trouble pooping (constipated) or have watery poop (diarrhea) for more than 2-3 days. You have pain when you pee or poop. Your belly pain wakes you up at night. Your pain gets worse with meals, after eating, or with certain foods. You are vomiting and cannot keep anything down. You have a fever. You have blood in your pee. Get help right away if: Your pain does not go away as soon as your doctor says it should. You cannot stop vomiting. Your pain is only in areas of your belly, such as the right side or the left lower part of the belly. You have bloody or black poop, or poop that looks like tar. You have very bad pain, cramping, or bloating in your belly. You have signs of not having enough fluid or water in your body (dehydration), such as: Dark pee, very little pee, or no pee. Cracked lips. Dry mouth. Sunken eyes. Sleepiness. Weakness. You have trouble breathing or chest pain. Summary Many cases of belly pain can be watched and treated at home. Watch your belly pain for any changes. Take  over-the-counter and prescription medicines only as told by your doctor. Contact a doctor if your belly pain changes or gets worse. Get help right away if you have very bad pain, cramping, or bloating in your belly. This information is not intended to replace advice given to you by your health care provider. Make sure you discuss any questions you have with your health care provider. Document Revised: 01/06/2019 Document Reviewed: 01/06/2019 Elsevier Patient Education  [place].

## 2022-06-07 ENCOUNTER — Ambulatory Visit (INDEPENDENT_AMBULATORY_CARE_PROVIDER_SITE_OTHER): Payer: Medicaid Other | Admitting: Nurse Practitioner

## 2022-06-07 ENCOUNTER — Encounter: Payer: Self-pay | Admitting: Nurse Practitioner

## 2022-06-07 VITALS — BP 113/74 | HR 62 | Temp 98.0°F | Ht 65.0 in | Wt 166.9 lb

## 2022-06-07 DIAGNOSIS — M25561 Pain in right knee: Secondary | ICD-10-CM

## 2022-06-07 DIAGNOSIS — R1084 Generalized abdominal pain: Secondary | ICD-10-CM

## 2022-06-07 DIAGNOSIS — G8929 Other chronic pain: Secondary | ICD-10-CM

## 2022-06-07 DIAGNOSIS — M25562 Pain in left knee: Secondary | ICD-10-CM | POA: Diagnosis not present

## 2022-06-07 NOTE — Assessment & Plan Note (Signed)
Ongoing for several years with worsening, reports weakness.  Recommend she continue the Celecoxib PRN as ordered by rheumatology.  Voltaren gel to both knees TID, educated her on this and where to obtain.  Will place referral to ortho due to ongoing pain for long while and weakness.

## 2022-06-07 NOTE — Progress Notes (Signed)
BP 113/74   Pulse 62   Temp 98 F (36.7 C) (Oral)   Ht '5\' 5"'$  (1.651 m)   Wt 166 lb 14.4 oz (75.7 kg)   LMP 12/06/2016 (Approximate)   SpO2 97%   BMI 27.77 kg/m    Subjective:    Patient ID: Brittney Hampton, female    DOB: 10/09/1965, 56 y.o.   MRN: [id number]  HPI: Brittney Hampton is a 56 y.o. female  Chief Complaint  Patient presents with   Abdominal Pain    Patient is here for four week follow up on Abdominal Pain.    Knee Problem    Patient says when she is going upstairs or lifting, she feels the strength in her knees are weaker.    ABDOMINAL PAIN  Presents for abdominal pain follow-up -- at last visit was treated with Flagyl and Diflucan due to yeast and BV infection noted.  She reports pain is better.  Seen by GI for pain on 01/17/22, she is to have EGD and colonoscopy, but has not obtained (cancelled 04/20/22) -- she has not rescheduled as of yet.  They increased her Protonix to taking 40 MG in the evening -- she reports she stopped taking as had a burning sensation with this.  Recent chest CT on 02/15/22 noted 6 mm solid right middle lobe pulmonary nodule -- recommend f/u in 6-12 months. Status: improved Treatments attempted: antibiotic and Protonix Fever: no Nausea: no Vomiting: no Weight loss: no Decreased appetite: yes Diarrhea: yes Constipation: no Blood in stool: no Heartburn:  occasional Jaundice: no Rash: no Dysuria/urinary frequency: no Hematuria: no History of sexually transmitted disease: no Recurrent NSAID use: no   KNEE PAIN Has noticed she is losing strength to both knees, notices most with lifting and going up stairs.  Present for long while.  History of positive ANA and last saw 08/30/21 rheumatology -- was to return in 4 months.  Does take Celebrex ordered, by rheumatology, every day.  Reports receiving flu shot and two other shots 1 1/2 weeks == felt bad after that, very weak.   Duration: chronic Involved knee: bilateral Mechanism of  injury:  none Location:diffuse Frequency: intermittent Radiation: no Aggravating factors: going up stairs and bending  Alleviating factors: unknown  Status: fluctuating Treatments attempted: Celebrex  Relief with NSAIDs?:  mild Weakness with weight bearing or walking: yes Sensation of giving way: no Locking: yes Popping: yes Bruising: no Swelling: no Redness: no Paresthesias/decreased sensation: no Fevers: no   Relevant past medical, surgical, family and social history reviewed and updated as indicated. Interim medical history since our last visit reviewed. Allergies and medications reviewed and updated.  Review of Systems  Constitutional:  Negative for activity change, appetite change, diaphoresis, fatigue and fever.  Respiratory:  Negative for cough, chest tightness, shortness of breath and wheezing.   Cardiovascular:  Negative for chest pain, palpitations and leg swelling.  Gastrointestinal:  Positive for abdominal pain. Negative for abdominal distention, constipation, diarrhea, nausea and vomiting.  Neurological: Negative.   Psychiatric/Behavioral: Negative.      Per HPI unless specifically indicated above     Objective:    BP 113/74   Pulse 62   Temp 98 F (36.7 C) (Oral)   Ht '5\' 5"'$  (1.651 m)   Wt 166 lb 14.4 oz (75.7 kg)   LMP 12/06/2016 (Approximate)   SpO2 97%   BMI 27.77 kg/m   Wt Readings from Last 3 Encounters:  06/07/22 166 lb 14.4 oz (75.7 kg)  05/10/22 166 lb 6.4 oz (75.5 kg)  01/25/22 168 lb 6.4 oz (76.4 kg)    Physical Exam Vitals and nursing note reviewed.  Constitutional:      General: She is awake. She is not in acute distress.    Appearance: She is well-developed and well-groomed. She is not ill-appearing or toxic-appearing.  HENT:     Head: Normocephalic.     Right Ear: Hearing normal.     Left Ear: Hearing normal.  Eyes:     General: Lids are normal.        Right eye: No discharge.        Left eye: No discharge.      Conjunctiva/sclera: Conjunctivae normal.     Pupils: Pupils are equal, round, and reactive to light.  Neck:     Thyroid: No thyromegaly.     Vascular: No carotid bruit.  Cardiovascular:     Rate and Rhythm: Normal rate and regular rhythm.     Heart sounds: Normal heart sounds. No murmur heard.    No gallop.  Pulmonary:     Effort: Pulmonary effort is normal. No accessory muscle usage or respiratory distress.     Breath sounds: Normal breath sounds.  Abdominal:     General: Bowel sounds are normal. There is no distension.     Palpations: Abdomen is soft.     Tenderness: There is no abdominal tenderness.  Musculoskeletal:     Cervical back: Normal range of motion and neck supple. No muscular tenderness.     Right knee: Crepitus present. No swelling, erythema or bony tenderness. Normal range of motion. Tenderness present over the lateral joint line.     Instability Tests: Medial McMurray test negative and lateral McMurray test negative.     Left knee: Crepitus present. No swelling, erythema or bony tenderness. Normal range of motion. Tenderness present over the lateral joint line.     Instability Tests: Medial McMurray test negative and lateral McMurray test negative.     Right lower leg: No edema.     Left lower leg: No edema.  Lymphadenopathy:     Cervical: No cervical adenopathy.  Skin:    General: Skin is warm and dry.  Neurological:     Mental Status: She is alert and oriented to person, place, and time.     Sensory: Sensation is intact.     Motor: Motor function is intact.     Coordination: Coordination is intact.     Gait: Gait is intact.     Deep Tendon Reflexes: Reflexes are normal and symmetric.     Reflex Scores:      Brachioradialis reflexes are 2+ on the right side and 2+ on the left side.      Patellar reflexes are 2+ on the right side and 2+ on the left side. Psychiatric:        Attention and Perception: Attention normal.        Mood and Affect: Mood normal.         Behavior: Behavior is cooperative.        Thought Content: Thought content normal.        Judgment: Judgment normal.    Results for orders placed or performed in visit on 05/10/22  WET PREP FOR [place], YEAST, CLUE   Specimen: Sterile Swab   Sterile Swab  Result Value Ref Range   Trichomonas Exam Negative Negative   Yeast Exam Positive (A) Negative   Clue Cell Exam Positive (A) Negative  Microscopic Examination   Urine  Result Value Ref Range   WBC, UA 0-5 0 - 5 /hpf   RBC, Urine 0-2 0 - 2 /hpf   Epithelial Cells (non renal) 0-10 0 - 10 /hpf   Bacteria, UA None seen None seen/Few  Urine Culture   Specimen: Urine   Urine  Result Value Ref Range   Urine Culture, Routine Final report    Organism ID, Bacteria No growth   Urinalysis, Routine w reflex microscopic  Result Value Ref Range   Specific Gravity, UA 1.020 1.005 - 1.030   pH, UA 8.5 (H) 5.0 - 7.5   Color, UA Yellow Yellow   Appearance Ur Clear Clear   Leukocytes,UA Trace (A) Negative   Protein,UA Negative Negative/Trace   Glucose, UA Negative Negative   Ketones, UA Negative Negative   RBC, UA Trace (A) Negative   Bilirubin, UA Negative Negative   Urobilinogen, Ur 0.2 0.2 - 1.0 mg/dL   Nitrite, UA Negative Negative   Microscopic Examination See below:       Assessment & Plan:   Problem List Items Addressed This Visit       Other   Chronic pain of both knees    Ongoing for several years with worsening, reports weakness.  Recommend she continue the Celecoxib PRN as ordered by rheumatology.  Voltaren gel to both knees TID, educated her on this and where to obtain.  Will place referral to ortho due to ongoing pain for long while and weakness.      Relevant Orders   Ambulatory referral to Orthopedic Surgery   Generalized abdominal pain - Primary    Overall improved at this time with completion of abx for BV and Diflucan for yeast infection.  At this time recommend she continue PPI for GERD and advised her to call  GI to scheduled EGD and colonoscopy as they recommended.        Follow up plan: Return in about 6 months (around 12/06/2022) for Annual physical.

## 2022-06-07 NOTE — Assessment & Plan Note (Signed)
Overall improved at this time with completion of abx for BV and Diflucan for yeast infection.  At this time recommend she continue PPI for GERD and advised her to call GI to scheduled EGD and colonoscopy as they recommended.

## 2022-07-05 ENCOUNTER — Encounter: Payer: Self-pay | Admitting: Nurse Practitioner

## 2022-07-05 ENCOUNTER — Ambulatory Visit (INDEPENDENT_AMBULATORY_CARE_PROVIDER_SITE_OTHER): Payer: Medicaid Other | Admitting: Nurse Practitioner

## 2022-07-05 VITALS — BP 96/83 | HR 72 | Temp 98.1°F | Ht 65.0 in | Wt 168.1 lb

## 2022-07-05 DIAGNOSIS — R21 Rash and other nonspecific skin eruption: Secondary | ICD-10-CM | POA: Diagnosis not present

## 2022-07-05 MED ORDER — PREDNISONE 10 MG PO TABS: ORAL_TABLET | ORAL | 0 refills | Status: DC

## 2022-07-05 MED ORDER — TRIAMCINOLONE ACETONIDE 0.1 % EX CREA: 1.0000 | TOPICAL_CREAM | Freq: Two times a day (BID) | CUTANEOUS | 0 refills | Status: DC

## 2022-07-05 MED ORDER — LORATADINE 10 MG PO TABS: 10.0000 mg | ORAL_TABLET | Freq: Every day | ORAL | 11 refills | Status: DC

## 2022-07-05 NOTE — Assessment & Plan Note (Signed)
Acute with waxing and waning episodes -- ?allergic rash.  At this time start Prednisone taper + Claritin 10 MG daily and Triamcinolone cream to rash as needed.  Referral to allergist for further assessment and recommend she return to rheumatology due to new symptom with rash and + ANA.

## 2022-07-05 NOTE — Patient Instructions (Addendum)
Stomach Doctor -- Call them -- Address: [address], [place], [place] [address] Phone: [phone]  Ortho doctor for pain (back and foot) = Address: [address], [place], [place] [address] Phone: [phone]  Rash, Adult  A rash is a change in the color of your skin. A rash can also change the way your skin feels. There are many different conditions and factors that can cause a rash. Follow these instructions at home: The goal of treatment is to stop the itching and keep the rash from spreading. Watch for any changes in your symptoms. Let your doctor know about them. Follow these instructions to help with your condition: Medicine Take or apply over-the-counter and prescription medicines only as told by your doctor. These may include medicines: To treat red or swollen skin (corticosteroid creams). To treat itching. To treat an allergy (oral antihistamines). To treat very bad symptoms (oral corticosteroids).  Skin care Put cool cloths (compresses) on the affected areas. Do not scratch or rub your skin. Avoid covering the rash. Make sure that the rash is exposed to air as much as possible. Managing itching and discomfort Avoid hot showers or baths. These can make itching worse. A cold shower may help. Try taking a bath with: Epsom salts. You can get these at your local pharmacy or grocery store. Follow the instructions on the package. Baking soda. Pour a small amount into the bath as told by your doctor. Colloidal oatmeal. You can get this at your local pharmacy or grocery store. Follow the instructions on the package. Try putting baking soda paste onto your skin. Stir water into baking soda until it gets like a paste. Try putting on a lotion that relieves itchiness (calamine lotion). Keep cool and out of the sun. Sweating and being hot can make itching worse. General instructions  Rest as needed. Drink enough fluid to keep your pee (urine) pale yellow. Wear  loose-fitting clothing. Avoid scented soaps, detergents, and perfumes. Use gentle soaps, detergents, perfumes, and other cosmetic products. Avoid anything that causes your rash. Keep a journal to help track what causes your rash. Write down: What you eat. What cosmetic products you use. What you drink. What you wear. This includes jewelry. Keep all follow-up visits as told by your doctor. This is important. Contact a doctor if: You sweat at night. You lose weight. You pee (urinate) more than normal. You pee less than normal, or you notice that your pee is a darker color than normal. You feel weak. You throw up (vomit). Your skin or the whites of your eyes look yellow (jaundice). Your skin: Tingles. Is numb. Your rash: Does not go away after a few days. Gets worse. You are: More thirsty than normal. More tired than normal. You have: New symptoms. Pain in your belly (abdomen). A fever. Watery poop (diarrhea). Get help right away if: You have a fever and your symptoms suddenly get worse. You start to feel mixed up (confused). You have a very bad headache or a stiff neck. You have very bad joint pains or stiffness. You have jerky movements that you cannot control (seizure). Your rash covers all or most of your body. The rash may or may not be painful. You have blisters that: Are on top of the rash. Grow larger. Grow together. Are painful. Are inside your nose or mouth. You have a rash that: Looks like purple pinprick-sized spots all over your body. Has a "bull's eye" or looks like a target. Is  red and painful, causes your skin to peel, and is not from being in the sun too long. Summary A rash is a change in the color of your skin. A rash can also change the way your skin feels. The goal of treatment is to stop the itching and keep the rash from spreading. Take or apply over-the-counter and prescription medicines only as told by your doctor. Contact a doctor if you have  new symptoms or symptoms that get worse. Keep all follow-up visits as told by your doctor. This is important. This information is not intended to replace advice given to you by your health care provider. Make sure you discuss any questions you have with your health care provider. Document Revised: 02/28/2022 Document Reviewed: 06/09/2021 Elsevier Patient Education  [place].

## 2022-07-05 NOTE — Progress Notes (Signed)
BP 96/83   Pulse 72   Temp 98.1 F (36.7 C) (Oral)   Ht '5\' 5"'$  (1.651 m)   Wt 168 lb 1.6 oz (76.2 kg)   LMP 12/06/2016 (Approximate)   BMI 27.97 kg/m    Subjective:    Patient ID: Brittney Hampton, female    DOB: 1965/10/07, 56 y.o.   MRN: [id number]  HPI: Brittney Hampton is a 56 y.o. female  Chief Complaint  Patient presents with   Rash    Patient is here for Rash all over her body. Patient says she woke up and noticed the rash all over her body. Patient says the irritation has been coming and going, and says that it is coming up as little red patches. Patient says she has not taken any medication for the rash. Patient says she has recently changed her fabric softener.    RASH Reports she woke up with rash to right wrist and back.  Reports she at baseline has irritation coming and going.  Has not taken medication for rash.   Duration:  days -- although has occasional flares Location:  back and hands  Itching: yes Burning: no Redness: yes Oozing: no Scaling: no Blisters: no Painful: no Fevers: no Change in detergents/soaps/personal care products: fabric softener change recently, but has had this issue before changing Recent illness: no Recent travel:no History of same: yes Context: stable Alleviating factors: nothing Treatments attempted:nothing Shortness of breath: no  Throat/tongue swelling: no Myalgias/arthralgias: no   Relevant past medical, surgical, family and social history reviewed and updated as indicated. Interim medical history since our last visit reviewed. Allergies and medications reviewed and updated.  Review of Systems  Constitutional:  Negative for activity change, appetite change, diaphoresis, fatigue and fever.  Respiratory:  Negative for cough, chest tightness and shortness of breath.   Cardiovascular:  Negative for chest pain, palpitations and leg swelling.  Gastrointestinal: Negative.   Skin:  Positive for rash.  Neurological:  Negative.   Psychiatric/Behavioral: Negative.      Per HPI unless specifically indicated above     Objective:    BP 96/83   Pulse 72   Temp 98.1 F (36.7 C) (Oral)   Ht '5\' 5"'$  (1.651 m)   Wt 168 lb 1.6 oz (76.2 kg)   LMP 12/06/2016 (Approximate)   BMI 27.97 kg/m   Wt Readings from Last 3 Encounters:  07/05/22 168 lb 1.6 oz (76.2 kg)  06/07/22 166 lb 14.4 oz (75.7 kg)  05/10/22 166 lb 6.4 oz (75.5 kg)    Physical Exam Vitals and nursing note reviewed.  Constitutional:      General: She is awake. She is not in acute distress.    Appearance: She is well-developed and well-groomed. She is not ill-appearing or toxic-appearing.  HENT:     Head: Normocephalic.     Right Ear: Hearing normal.     Left Ear: Hearing normal.  Eyes:     General: Lids are normal.        Right eye: No discharge.        Left eye: No discharge.     Conjunctiva/sclera: Conjunctivae normal.     Pupils: Pupils are equal, round, and reactive to light.  Neck:     Thyroid: No thyromegaly.     Vascular: No carotid bruit.  Cardiovascular:     Rate and Rhythm: Normal rate and regular rhythm.     Heart sounds: Normal heart sounds. No murmur heard.    No  gallop.  Pulmonary:     Effort: Pulmonary effort is normal. No accessory muscle usage or respiratory distress.     Breath sounds: Normal breath sounds.  Abdominal:     General: Bowel sounds are normal. There is no distension.     Palpations: Abdomen is soft.     Tenderness: There is no abdominal tenderness.  Musculoskeletal:     Cervical back: Normal range of motion and neck supple. No muscular tenderness.     Right lower leg: No edema.     Left lower leg: No edema.  Lymphadenopathy:     Cervical: No cervical adenopathy.  Skin:    General: Skin is warm and dry.     Findings: Rash present. Rash is macular and scaling.     Comments: Small, round patch of erythema with mild scaling to exterior on right inner wrist and then two small patches with crusting  to right lower back.  Neurological:     Mental Status: She is alert and oriented to person, place, and time.     Sensory: Sensation is intact.     Motor: Motor function is intact.     Coordination: Coordination is intact.     Gait: Gait is intact.     Deep Tendon Reflexes: Reflexes are normal and symmetric.     Reflex Scores:      Brachioradialis reflexes are 2+ on the right side and 2+ on the left side.      Patellar reflexes are 2+ on the right side and 2+ on the left side. Psychiatric:        Attention and Perception: Attention normal.        Mood and Affect: Mood normal.        Behavior: Behavior is cooperative.        Thought Content: Thought content normal.        Judgment: Judgment normal.     Results for orders placed or performed in visit on 05/10/22  WET PREP FOR [place], YEAST, CLUE   Specimen: Sterile Swab   Sterile Swab  Result Value Ref Range   Trichomonas Exam Negative Negative   Yeast Exam Positive (A) Negative   Clue Cell Exam Positive (A) Negative  Microscopic Examination   Urine  Result Value Ref Range   WBC, UA 0-5 0 - 5 /hpf   RBC, Urine 0-2 0 - 2 /hpf   Epithelial Cells (non renal) 0-10 0 - 10 /hpf   Bacteria, UA None seen None seen/Few  Urine Culture   Specimen: Urine   Urine  Result Value Ref Range   Urine Culture, Routine Final report    Organism ID, Bacteria No growth   Urinalysis, Routine w reflex microscopic  Result Value Ref Range   Specific Gravity, UA 1.020 1.005 - 1.030   pH, UA 8.5 (H) 5.0 - 7.5   Color, UA Yellow Yellow   Appearance Ur Clear Clear   Leukocytes,UA Trace (A) Negative   Protein,UA Negative Negative/Trace   Glucose, UA Negative Negative   Ketones, UA Negative Negative   RBC, UA Trace (A) Negative   Bilirubin, UA Negative Negative   Urobilinogen, Ur 0.2 0.2 - 1.0 mg/dL   Nitrite, UA Negative Negative   Microscopic Examination See below:       Assessment & Plan:   Problem List Items Addressed This Visit        Musculoskeletal and Integument   Rash - Primary    Acute with waxing and waning episodes -- ?  allergic rash.  At this time start Prednisone taper + Claritin 10 MG daily and Triamcinolone cream to rash as needed.  Referral to allergist for further assessment and recommend she return to rheumatology due to new symptom with rash and + ANA.      Relevant Orders   Ambulatory referral to Allergy     Follow up plan: Return in about 4 weeks (around 08/02/2022) for Rash.

## 2022-07-06 ENCOUNTER — Telehealth: Payer: Self-pay

## 2022-07-06 NOTE — Telephone Encounter (Signed)
-----   Message from Lucilla Lame, MD sent at 07/05/2022  8:52 PM EDT -----  ----- Message ----- From: Venita Lick, NP Sent: 07/05/2022   4:21 PM EDT To: Lucilla Lame, MD  She would like to do EGD and colonoscopy, I know she cancelled -- could you have staff member call her (Spanish speaking) her major concern is her heart burn with prep and not eating -- she is very concerned about liquid prep.  Needs a little more information but would like to get scheduled.

## 2022-07-07 ENCOUNTER — Other Ambulatory Visit: Payer: Self-pay

## 2022-07-07 DIAGNOSIS — K219 Gastro-esophageal reflux disease without esophagitis: Secondary | ICD-10-CM

## 2022-07-07 DIAGNOSIS — Z1211 Encounter for screening for malignant neoplasm of colon: Secondary | ICD-10-CM

## 2022-07-07 NOTE — Telephone Encounter (Signed)
Called patient and answered what her questions were and rescheduled her colonoscopy and EGD will be on 08/01/2022 at [facility].

## 2022-07-10 ENCOUNTER — Encounter (INDEPENDENT_AMBULATORY_CARE_PROVIDER_SITE_OTHER): Payer: Self-pay

## 2022-07-16 ENCOUNTER — Ambulatory Visit
Admission: EM | Admit: 2022-07-16 | Discharge: 2022-07-16 | Disposition: A | Discharge status: Home / Self Care | Payer: Medicaid Other | Attending: Family Medicine | Admitting: Family Medicine

## 2022-07-16 ENCOUNTER — Ambulatory Visit (INDEPENDENT_AMBULATORY_CARE_PROVIDER_SITE_OTHER): Payer: Medicaid Other

## 2022-07-16 ENCOUNTER — Encounter: Payer: Self-pay | Admitting: Emergency Medicine

## 2022-07-16 DIAGNOSIS — M545 Low back pain, unspecified: Secondary | ICD-10-CM | POA: Diagnosis not present

## 2022-07-16 DIAGNOSIS — S39012A Strain of muscle, fascia and tendon of lower back, initial encounter: Secondary | ICD-10-CM

## 2022-07-16 DIAGNOSIS — M25551 Pain in right hip: Secondary | ICD-10-CM

## 2022-07-16 DIAGNOSIS — M546 Pain in thoracic spine: Secondary | ICD-10-CM

## 2022-07-16 MED ORDER — METHOCARBAMOL 500 MG PO TABS: 500.0000 mg | ORAL_TABLET | Freq: Two times a day (BID) | ORAL | 0 refills | Status: DC

## 2022-07-16 MED ORDER — NAPROXEN 500 MG PO TABS: 500.0000 mg | ORAL_TABLET | Freq: Two times a day (BID) | ORAL | 0 refills | Status: DC
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
## 2022-07-25 ENCOUNTER — Ambulatory Visit: Payer: Medicaid Other | Admitting: Internal Medicine

## 2022-07-30 NOTE — Patient Instructions (Signed)
Rash, Adult  A rash is a change in the color of your skin. A rash can also change the way your skin feels. There are many different conditions and factors that can cause a rash. Follow these instructions at home: The goal of treatment is to stop the itching and keep the rash from spreading. Watch for any changes in your symptoms. Let your doctor know about them. Follow these instructions to help with your condition: Medicine Take or apply over-the-counter and prescription medicines only as told by your doctor. These may include medicines: To treat red or swollen skin (corticosteroid creams). To treat itching. To treat an allergy (oral antihistamines). To treat very bad symptoms (oral corticosteroids).  Skin care Put cool cloths (compresses) on the affected areas. Do not scratch or rub your skin. Avoid covering the rash. Make sure that the rash is exposed to air as much as possible. Managing itching and discomfort Avoid hot showers or baths. These can make itching worse. A cold shower may help. Try taking a bath with: Epsom salts. You can get these at your local pharmacy or grocery store. Follow the instructions on the package. Baking soda. Pour a small amount into the bath as told by your doctor. Colloidal oatmeal. You can get this at your local pharmacy or grocery store. Follow the instructions on the package. Try putting baking soda paste onto your skin. Stir water into baking soda until it gets like a paste. Try putting on a lotion that relieves itchiness (calamine lotion). Keep cool and out of the sun. Sweating and being hot can make itching worse. General instructions  Rest as needed. Drink enough fluid to keep your pee (urine) pale yellow. Wear loose-fitting clothing. Avoid scented soaps, detergents, and perfumes. Use gentle soaps, detergents, perfumes, and other cosmetic products. Avoid anything that causes your rash. Keep a journal to help track what causes your rash. Write  down: What you eat. What cosmetic products you use. What you drink. What you wear. This includes jewelry. Keep all follow-up visits as told by your doctor. This is important. Contact a doctor if: You sweat at night. You lose weight. You pee (urinate) more than normal. You pee less than normal, or you notice that your pee is a darker color than normal. You feel weak. You throw up (vomit). Your skin or the whites of your eyes look yellow (jaundice). Your skin: Tingles. Is numb. Your rash: Does not go away after a few days. Gets worse. You are: More thirsty than normal. More tired than normal. You have: New symptoms. Pain in your belly (abdomen). A fever. Watery poop (diarrhea). Get help right away if: You have a fever and your symptoms suddenly get worse. You start to feel mixed up (confused). You have a very bad headache or a stiff neck. You have very bad joint pains or stiffness. You have jerky movements that you cannot control (seizure). Your rash covers all or most of your body. The rash may or may not be painful. You have blisters that: Are on top of the rash. Grow larger. Grow together. Are painful. Are inside your nose or mouth. You have a rash that: Looks like purple pinprick-sized spots all over your body. Has a "bull's eye" or looks like a target. Is red and painful, causes your skin to peel, and is not from being in the sun too long. Summary A rash is a change in the color of your skin. A rash can also change the way your skin   feels. The goal of treatment is to stop the itching and keep the rash from spreading. Take or apply over-the-counter and prescription medicines only as told by your doctor. Contact a doctor if you have new symptoms or symptoms that get worse. Keep all follow-up visits as told by your doctor. This is important. This information is not intended to replace advice given to you by your health care provider. Make sure you discuss any  questions you have with your health care provider. Document Revised: 02/28/2022 Document Reviewed: 06/09/2021 Elsevier Patient Education  2023 Elsevier Inc.  

## 2022-07-31 ENCOUNTER — Encounter: Payer: Self-pay | Admitting: Gastroenterology

## 2022-08-01 ENCOUNTER — Encounter: Admission: RE | Payer: Self-pay | Source: Home / Self Care

## 2022-08-01 ENCOUNTER — Ambulatory Visit: Admission: RE | Admit: 2022-08-01 | Payer: Medicaid Other | Source: Home / Self Care | Admitting: Gastroenterology

## 2022-08-01 SURGERY — COLONOSCOPY WITH PROPOFOL
Anesthesia: General

## 2022-08-02 ENCOUNTER — Encounter: Payer: Self-pay | Admitting: Nurse Practitioner

## 2022-08-02 ENCOUNTER — Ambulatory Visit: Payer: Medicaid Other | Admitting: Nurse Practitioner

## 2022-08-02 VITALS — BP 117/77 | HR 74 | Temp 98.1°F | Ht 65.0 in | Wt 172.1 lb

## 2022-08-02 DIAGNOSIS — R21 Rash and other nonspecific skin eruption: Secondary | ICD-10-CM

## 2022-08-02 DIAGNOSIS — L658 Other specified nonscarring hair loss: Secondary | ICD-10-CM | POA: Diagnosis not present

## 2022-08-02 DIAGNOSIS — R768 Other specified abnormal immunological findings in serum: Secondary | ICD-10-CM | POA: Diagnosis not present

## 2022-08-02 NOTE — Assessment & Plan Note (Signed)
Over past months -- has history of positive ANA, has not returned to rheumatology, will place new referral to return for visit as new symptoms presenting - intermittent rashes, hair loss, and vision changes.  Labs today for further assessment.

## 2022-08-02 NOTE — Progress Notes (Signed)
BP 117/77   Pulse 74   Temp 98.1 F (36.7 C) (Oral)   Ht '5\' 5"'$  (1.651 m)   Wt 172 lb 1.6 oz (78.1 kg)   LMP 12/06/2016 (Approximate)   SpO2 96%   BMI 28.64 kg/m    Subjective:    Patient ID: Brittney Hampton, female    DOB: October 02, 1965, 56 y.o.   MRN: [id number]  HPI: Brittney Hampton is a 56 y.o. female  Chief Complaint  Patient presents with   Rash    Rash still coming and going, on wrists and on back.    Alopecia    Patient states that she is afraid to brush her hair because of the hair loss.    RASH Follow-up for rash today.  This continues to come and go, was treated with Prednisone taper and Claritin 10 MG daily + Triamcinolone.  A referral was placed to allergist ,she has not heard from them.  Wearing Latex gloves at work.  History of positive ANA, has not seen rheumatology since December 2022.  Has had recent vision changes, 4 months ago started (saw eye doctor, who told her vision was okay but gave her eye drops) + notices some hair loss ongoing for one year -- afraid to brush hair as lots of hair will come out. Duration:  months  Location: trunk, arms, and legs  Itching: no Burning: no Redness: yes Oozing: no Scaling: no Blisters: no Painful: no Fevers: no Change in detergents/soaps/personal care products: no Recent illness: no Recent travel:no History of same: yes Context: better Alleviating factors: Triamcinolone cream and Prednisone Treatments attempted: as above + Claritin Shortness of breath: no  Throat/tongue swelling: no Myalgias/arthralgias: no   Relevant past medical, surgical, family and social history reviewed and updated as indicated. Interim medical history since our last visit reviewed. Allergies and medications reviewed and updated.  Review of Systems  Constitutional:  Negative for activity change, appetite change, diaphoresis, fatigue and fever.  Eyes:  Positive for visual disturbance.  Respiratory:  Negative for cough, chest  tightness and shortness of breath.   Cardiovascular:  Negative for chest pain, palpitations and leg swelling.  Gastrointestinal: Negative.   Skin:  Positive for rash.  Neurological: Negative.   Psychiatric/Behavioral: Negative.      Per HPI unless specifically indicated above     Objective:    BP 117/77   Pulse 74   Temp 98.1 F (36.7 C) (Oral)   Ht '5\' 5"'$  (1.651 m)   Wt 172 lb 1.6 oz (78.1 kg)   LMP 12/06/2016 (Approximate)   SpO2 96%   BMI 28.64 kg/m   Wt Readings from Last 3 Encounters:  08/02/22 172 lb 1.6 oz (78.1 kg)  07/16/22 167 lb 15.9 oz (76.2 kg)  07/05/22 168 lb 1.6 oz (76.2 kg)    Physical Exam Vitals and nursing note reviewed.  Constitutional:      General: She is awake. She is not in acute distress.    Appearance: She is well-developed and well-groomed. She is not ill-appearing or toxic-appearing.  HENT:     Head: Normocephalic. Hair is abnormal (mild loss noted).     Right Ear: Hearing normal.     Left Ear: Hearing normal.  Eyes:     General: Lids are normal.        Right eye: No discharge.        Left eye: No discharge.     Extraocular Movements: Extraocular movements intact.     Conjunctiva/sclera:  Conjunctivae normal.     Pupils: Pupils are equal, round, and reactive to light.     Visual Fields: Right eye visual fields normal and left eye visual fields normal.  Neck:     Thyroid: No thyromegaly.     Vascular: No carotid bruit.  Cardiovascular:     Rate and Rhythm: Normal rate and regular rhythm.     Heart sounds: Normal heart sounds. No murmur heard.    No gallop.  Pulmonary:     Effort: Pulmonary effort is normal. No accessory muscle usage or respiratory distress.     Breath sounds: Normal breath sounds.  Abdominal:     General: Bowel sounds are normal. There is no distension.     Palpations: Abdomen is soft.     Tenderness: There is no abdominal tenderness.  Musculoskeletal:     Cervical back: Normal range of motion and neck supple. No  muscular tenderness.     Right lower leg: No edema.     Left lower leg: No edema.  Lymphadenopathy:     Cervical: No cervical adenopathy.  Skin:    General: Skin is warm and dry.     Findings: Rash present. Rash is macular and scaling.     Comments: Small, round patch of erythema with mild scaling to interior right wrist.  Neurological:     Mental Status: She is alert and oriented to person, place, and time.     Sensory: Sensation is intact.     Motor: Motor function is intact.     Coordination: Coordination is intact.     Gait: Gait is intact.     Deep Tendon Reflexes: Reflexes are normal and symmetric.     Reflex Scores:      Brachioradialis reflexes are 2+ on the right side and 2+ on the left side.      Patellar reflexes are 2+ on the right side and 2+ on the left side. Psychiatric:        Attention and Perception: Attention normal.        Mood and Affect: Mood normal.        Behavior: Behavior is cooperative.        Thought Content: Thought content normal.        Judgment: Judgment normal.     Results for orders placed or performed in visit on 05/10/22  WET PREP FOR [place], YEAST, CLUE   Specimen: Sterile Swab   Sterile Swab  Result Value Ref Range   Trichomonas Exam Negative Negative   Yeast Exam Positive (A) Negative   Clue Cell Exam Positive (A) Negative  Microscopic Examination   Urine  Result Value Ref Range   WBC, UA 0-5 0 - 5 /hpf   RBC, Urine 0-2 0 - 2 /hpf   Epithelial Cells (non renal) 0-10 0 - 10 /hpf   Bacteria, UA None seen None seen/Few  Urine Culture   Specimen: Urine   Urine  Result Value Ref Range   Urine Culture, Routine Final report    Organism ID, Bacteria No growth   Urinalysis, Routine w reflex microscopic  Result Value Ref Range   Specific Gravity, UA 1.020 1.005 - 1.030   pH, UA 8.5 (H) 5.0 - 7.5   Color, UA Yellow Yellow   Appearance Ur Clear Clear   Leukocytes,UA Trace (A) Negative   Protein,UA Negative Negative/Trace   Glucose, UA  Negative Negative   Ketones, UA Negative Negative   RBC, UA Trace (A) Negative  Bilirubin, UA Negative Negative   Urobilinogen, Ur 0.2 0.2 - 1.0 mg/dL   Nitrite, UA Negative Negative   Microscopic Examination See below:       Assessment & Plan:   Problem List Items Addressed This Visit       Musculoskeletal and Integument   Female pattern hair loss    Over past months -- has history of positive ANA, has not returned to rheumatology, will place new referral to return for visit as new symptoms presenting - intermittent rashes, hair loss, and vision changes.  Labs today for further assessment.      Relevant Orders   Testosterone, free, total([facility])   DHEA-sulfate   TSH   T4, free   Thyroid peroxidase antibody   CBC with Differential/Platelet   Comprehensive metabolic panel   Vitamin Q11   Rash - Primary    Improving, but still waxing and waning.  Has not heard to schedule allergist appointment.  Uses Latex gloves at work.  Continue Claritin 10 MG daily and Triamcinolone cream to rash as needed.  Recommend she return to rheumatology due to new symptom with rash/hair loss/vision changes and + ANA.  New referral placed.  Check labs today, including allergy testing.      Relevant Orders   Allergen Profile, Basic Food   Allergen Profile, Shellfish   Allergens(7)   Latex, IgE     Other   ANA positive    Previously with joint pains, now new symptoms presenting to include hair loss, intermittent rashes, and vision changes.  Will place referral to return to rheumatology.  Labs today for further assessment.      Relevant Orders   Ambulatory referral to Rheumatology   ANA 12 Plus Profile ([facility])   Rheumatoid factor   Vitamin B12     Follow up plan: Return in about 4 weeks (around 08/30/2022) for [place].

## 2022-08-02 NOTE — Assessment & Plan Note (Signed)
Improving, but still waxing and waning.  Has not heard to schedule allergist appointment.  Uses Latex gloves at work.  Continue Claritin 10 MG daily and Triamcinolone cream to rash as needed.  Recommend she return to rheumatology due to new symptom with rash/hair loss/vision changes and + ANA.  New referral placed.  Check labs today, including allergy testing.

## 2022-08-02 NOTE — Addendum Note (Signed)
Addended by: Marnee Guarneri T on: 08/02/2022 11:38 AM   Modules accepted: Orders

## 2022-08-02 NOTE — Assessment & Plan Note (Signed)
Previously with joint pains, now new symptoms presenting to include hair loss, intermittent rashes, and vision changes.  Will place referral to return to rheumatology.  Labs today for further assessment.

## 2022-08-07 NOTE — Progress Notes (Signed)
Good afternoon, please let Brittney Hampton know labs have returned with exception of a few that are still pending and will alert her when these return: - Thyroid lab is normal.  Testosterone and DHEA are on lower side, which sometimes we see with menopause, but it could also cause hair loss issues.  We can talk next visit about treatments to help this, including topical steroid creams or a visit with dermatology. - Allergy testing via blood is currently all negative, including latex. - CBC shows no anemia or infection and kidney and liver function remain normal.  Will alert her when remainder of labs return if abnormal findings.  Any questions? Keep being awesome!!  Thank you for allowing me to participate in your care.  I appreciate you. Kindest regards, Kullen Tomasetti

## 2022-08-12 LAB — CBC WITH DIFFERENTIAL/PLATELET
Basophils Absolute: 0 10*3/uL (ref 0.0–0.2)
Basos: 1 %
EOS (ABSOLUTE): 0.1 10*3/uL (ref 0.0–0.4)
Eos: 2 %
Hematocrit: 44.1 % (ref 34.0–46.6)
Hemoglobin: 14.5 g/dL (ref 11.1–15.9)
Immature Grans (Abs): 0 10*3/uL (ref 0.0–0.1)
Immature Granulocytes: 0 %
Lymphocytes Absolute: 1.7 10*3/uL (ref 0.7–3.1)
Lymphs: 37 %
MCH: 30.3 pg (ref 26.6–33.0)
MCHC: 32.9 g/dL (ref 31.5–35.7)
MCV: 92 fL (ref 79–97)
Monocytes Absolute: 0.3 10*3/uL (ref 0.1–0.9)
Monocytes: 7 %
Neutrophils Absolute: 2.5 10*3/uL (ref 1.4–7.0)
Neutrophils: 53 %
Platelets: 233 10*3/uL (ref 150–450)
RBC: 4.79 x10E6/uL (ref 3.77–5.28)
RDW: 13.2 % (ref 11.7–15.4)
WBC: 4.7 10*3/uL (ref 3.4–10.8)

## 2022-08-12 LAB — LATEX, IGE: Latex IgE: <0.1 kU/L

## 2022-08-12 LAB — ALLERGENS(7)
Brazil Nut IgE: <0.1 kU/L
F020-IgE Almond: <0.1 kU/L
F202-IgE Cashew Nut: <0.1 kU/L
Hazelnut (Filbert) IgE: <0.1 kU/L
Peanut IgE: <0.1 kU/L
Pecan Nut IgE: <0.1 kU/L
Walnut IgE: <0.1 kU/L

## 2022-08-12 LAB — ANA 12 PLUS PROFILE, POSITIVE
Anti-CCP Ab, IgG & IgA (RDL): <20 Units (ref <20)
Anti-Cardiolipin Ab, IgA (RDL): <12 APL U/mL (ref <12)
Anti-Cardiolipin Ab, IgG (RDL): <15 GPL U/mL (ref <15)
Anti-Cardiolipin Ab, IgM (RDL): <13 MPL U/mL (ref <13)
Anti-Centromere Ab (RDL): <1:40 {titer}
Anti-Chromatin Ab, IgG (RDL): <20 Units (ref <20)
Anti-La (SS-B) Ab (RDL): <20 Units (ref <20)
Anti-Ro (SS-A) Ab (RDL): <20 Units (ref <20)
Anti-Scl-70 Ab (RDL): <20 Units (ref <20)
Anti-Sm Ab (RDL): <20 Units (ref <20)
Anti-U1 RNP Ab (RDL): <20 Units (ref <20)
Anti-dsDNA Ab by Farr(RDL): <8 IU/mL (ref <8.0)
C3 Complement (RDL): 150 mg/dL (ref 82–167)
C4 Complement (RDL): 21 mg/dL (ref 14–44)
Speckled Pattern: 1:160 {titer} — ABNORMAL HIGH

## 2022-08-12 LAB — ALLERGEN PROFILE, SHELLFISH
Clam IgE: <0.1 kU/L
F023-IgE Crab: <0.1 kU/L
F080-IgE Lobster: <0.1 kU/L
F290-IgE Oyster: <0.1 kU/L
Scallop IgE: <0.1 kU/L
Shrimp IgE: <0.1 kU/L

## 2022-08-12 LAB — DHEA-SULFATE: DHEA-SO4: 34.5 ug/dL — ABNORMAL LOW (ref 41.2–243.7)

## 2022-08-12 LAB — COMPREHENSIVE METABOLIC PANEL
ALT: 17 IU/L (ref 0–32)
AST: 18 IU/L (ref 0–40)
Albumin/Globulin Ratio: 1.8 (ref 1.2–2.2)
Albumin: 4.2 g/dL (ref 3.8–4.9)
Alkaline Phosphatase: 88 IU/L (ref 44–121)
BUN/Creatinine Ratio: 13 (ref 9–23)
BUN: 9 mg/dL (ref 6–24)
Bilirubin Total: 0.6 mg/dL (ref 0.0–1.2)
CO2: 23 mmol/L (ref 20–29)
Calcium: 9.6 mg/dL (ref 8.7–10.2)
Chloride: 104 mmol/L (ref 96–106)
Creatinine, Ser: 0.67 mg/dL (ref 0.57–1.00)
Globulin, Total: 2.3 g/dL (ref 1.5–4.5)
Glucose: 105 mg/dL — ABNORMAL HIGH (ref 70–99)
Potassium: 3.8 mmol/L (ref 3.5–5.2)
Sodium: 141 mmol/L (ref 134–144)
Total Protein: 6.5 g/dL (ref 6.0–8.5)
eGFR: 104 mL/min/{1.73_m2} (ref >59)

## 2022-08-12 LAB — TESTOSTERONE, FREE, TOTAL, SHBG
Sex Hormone Binding: 26.3 nmol/L (ref 17.3–125.0)
Testosterone, Free: 0.4 pg/mL (ref 0.0–4.2)
Testosterone: <3 ng/dL — ABNORMAL LOW (ref 4–50)

## 2022-08-12 LAB — THYROID PEROXIDASE ANTIBODY: Thyroperoxidase Ab SerPl-aCnc: <9 IU/mL (ref 0–34)

## 2022-08-12 LAB — ALLERGEN PROFILE, BASIC
Codfish IgE: <0.1 kU/L
Egg White IgE: <0.1 kU/L
Milk IgE: <0.1 kU/L
Soybean IgE: <0.1 kU/L
Wheat IgE: <0.1 kU/L

## 2022-08-12 LAB — T4, FREE: Free T4: 0.98 ng/dL (ref 0.82–1.77)

## 2022-08-12 LAB — TSH: TSH: 1.92 u[IU]/mL (ref 0.450–4.500)

## 2022-08-12 LAB — RHEUMATOID FACTOR: Rheumatoid fact SerPl-aCnc: <10 IU/mL (ref <14.0)

## 2022-08-12 LAB — VITAMIN B12: Vitamin B-12: 649 pg/mL (ref 232–1245)

## 2022-08-12 LAB — ANA 12 PLUS PROFILE (RDL): Anti-Nuclear Ab by IFA (RDL): POSITIVE — AB

## 2022-08-14 ENCOUNTER — Ambulatory Visit: Payer: Medicaid Other | Admitting: Gastroenterology
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
## 2022-08-16 ENCOUNTER — Encounter: Payer: Self-pay | Admitting: Internal Medicine

## 2022-08-16 ENCOUNTER — Encounter: Payer: Medicaid Other | Admitting: Internal Medicine

## 2022-08-17 NOTE — Progress Notes (Signed)
Error

## 2022-08-22 ENCOUNTER — Encounter: Payer: Medicaid Other | Admitting: Family Medicine

## 2022-08-25 ENCOUNTER — Ambulatory Visit
Admission: EM | Admit: 2022-08-25 | Discharge: 2022-08-25 | Disposition: A | Discharge status: Home / Self Care | Payer: Medicaid Other | Attending: Family Medicine | Admitting: Family Medicine

## 2022-08-25 ENCOUNTER — Encounter: Payer: Self-pay | Admitting: Emergency Medicine

## 2022-08-25 ENCOUNTER — Ambulatory Visit: Payer: Self-pay

## 2022-08-25 DIAGNOSIS — Z1152 Encounter for screening for COVID-19: Secondary | ICD-10-CM | POA: Diagnosis not present

## 2022-08-25 DIAGNOSIS — J329 Chronic sinusitis, unspecified: Secondary | ICD-10-CM | POA: Diagnosis present

## 2022-08-25 DIAGNOSIS — J40 Bronchitis, not specified as acute or chronic: Secondary | ICD-10-CM | POA: Diagnosis not present

## 2022-08-25 LAB — RESP PANEL BY RT-PCR (RSV, FLU A&B, COVID)  RVPGX2
Influenza A by PCR: NEGATIVE
Influenza B by PCR: NEGATIVE
Resp Syncytial Virus by PCR: NEGATIVE
SARS Coronavirus 2 by RT PCR: NEGATIVE

## 2022-08-25 MED ORDER — AZITHROMYCIN 250 MG PO TABS: 250.0000 mg | ORAL_TABLET | Freq: Every day | ORAL | 0 refills | Status: DC

## 2022-08-25 MED ORDER — PREDNISONE 20 MG PO TABS: 40.0000 mg | ORAL_TABLET | Freq: Every day | ORAL | 0 refills | Status: AC

## 2022-08-25 NOTE — ED Provider Notes (Signed)
[facility]    CSN: [id number] Arrival date & time: 08/25/22  1220      History   Chief Complaint Chief Complaint  Patient presents with   Cough   Sore Throat    HPI Brittney Hampton is a 56 y.o. female.   HPI   Brittney Hampton presents for cough and sore throat about a week ago. Has nausea, dizziness, body aches and undocumented fever. Last night, she had chest discomfort and upper back pain that is cough with cough.  Her cough bothers her the most. Had asthma as a kid but has not had a issue since she was lung. She had a spot on her lung after COVID and reports the doctor said it was nothing to worry about. No vomiting or diarrhea.     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   Rash 07/05/2022   Chronic pain of both knees 06/07/2022   Lumbar spondylolysis 02/25/2022   Osteopenia determined by x-ray 01/27/2022   Nodule of lower lobe of right lung 01/26/2022   Vitamin D deficiency 10/25/2021   Varicose veins of both lower extremities with pain 10/24/2021   GERD (gastroesophageal reflux disease) 10/24/2021   History of kidney stones 08/16/2021   Mild hyperlipidemia 08/16/2021   Insomnia 06/20/2021   Hepatic hemangioma 04/30/2021   Hepatic steatosis 04/30/2021   ANA positive 04/04/2021   B12 deficiency 04/02/2021   Joint pain 04/01/2021   IFG (impaired fasting glucose) 04/01/2021   Financial difficulties 04/01/2021   Aortic atherosclerosis ([place]) 03/31/2021    Past Surgical History:  Procedure Laterality Date   ABDOMINAL SURGERY     CESAREAN SECTION      OB History   No obstetric history on file.      Home  Medications    Prior to Admission medications   Medication Sig Start Date End Date Taking? Authorizing Provider  azithromycin (ZITHROMAX Z-PAK) 250 MG tablet Take 1 tablet (250 mg total) by mouth daily. Take 2 tablets on day 1. 08/25/22  Yes Farah Lepak, DO  predniSONE (DELTASONE) 20 MG tablet Take 2 tablets (40 mg total) by mouth daily for 5 days. 08/25/22 08/30/22 Yes Duston Smolenski, Ronnette Juniper, DO  amitriptyline (ELAVIL) 25 MG tablet Take 2 tablets (50 mg total) by mouth at bedtime. Patient not taking: Reported on 08/16/2022 05/20/21   Marnee Guarneri T, NP  Cholecalciferol 1.25 MG (50000 UT) TABS Take 1 tablet by mouth once a week. 12/05/21   Cannady, Henrine Screws T, NP  loratadine (CLARITIN) 10 MG tablet Take 1 tablet (10 mg total) by mouth daily. 07/05/22   Cannady, Henrine Screws T, NP  methocarbamol (ROBAXIN) 500 MG tablet Take 1 tablet (500 mg total) by mouth 2 (two) times daily. 07/16/22   Lyndee Hensen, DO  Multiple Vitamins-Minerals (ZINC PO) Take by mouth.    [provider]  Na Sulfate-K Sulfate-Mg Sulf 17.5-3.13-1.6 GM/177ML SOLN Take by mouth. Patient not taking: Reported on 08/16/2022 01/17/22   [provider]  naproxen (NAPROSYN) 500 MG tablet Take 1 tablet (500 mg total) by mouth 2 (  two) times daily with a meal. 07/16/22   Khamari Sheehan, DO  rizatriptan (MAXALT) 5 MG tablet Take 1 tablet (5 mg total) by mouth as needed for migraine. May repeat in 2 hours if needed 04/01/21   Marnee Guarneri T, NP  triamcinolone cream (KENALOG) 0.1 % Apply 1 Application topically 2 (two) times daily. 07/05/22   Cannady, Henrine Screws T, NP  vitamin B-12 (CYANOCOBALAMIN) 1000 MCG tablet Take 1 tablet (1,000 mcg total) by mouth daily. 10/25/21   Cannady, Henrine Screws T, NP  VITAMIN E PO Take by mouth.    [provider]    Family History Family History  Problem Relation Age of Onset   Osteoarthritis Mother    Diabetes Father    Cancer Brother     Social History Social History   Tobacco Use    Smoking status: Never   Smokeless tobacco: Never  Vaping Use   Vaping Use: Never used  Substance Use Topics   Alcohol use: Never   Drug use: Never     Allergies   Patient has no known allergies.   Review of Systems Review of Systems: negative unless otherwise stated in HPI.      Physical Exam Triage Vital Signs ED Triage Vitals  Enc Vitals Group     BP 08/25/22 1349 111/77     Pulse Rate 08/25/22 1349 73     Resp 08/25/22 1349 14     Temp 08/25/22 1349 98 F (36.7 C)     Temp Source 08/25/22 1349 Oral     SpO2 08/25/22 1349 95 %     Weight 08/25/22 1346 169 lb 1.5 oz (76.7 kg)     Height 08/25/22 1346 '5\' 5"'$  (1.651 m)     Head Circumference --      Peak Flow --      Pain Score 08/25/22 1346 8     Pain Loc --      Pain Edu? --      Excl. in [place]? --    No data found.  Updated Vital Signs BP 111/77 (BP Location: Right Arm)   Pulse 73   Temp 98 F (36.7 C) (Oral)   Resp 14   Ht '5\' 5"'$  (1.651 m)   Wt 76.7 kg   LMP 12/06/2016 (Approximate)   SpO2 95%   BMI 28.14 kg/m   Visual Acuity Right Eye Distance:   Left Eye Distance:   Bilateral Distance:    Right Eye Near:   Left Eye Near:    Bilateral Near:     Physical Exam GEN:     alert, non-toxic appearing female in no distress    HENT:  mucus membranes moist, oropharyngeal without lesions or exudate, no tonsillar hypertrophy or oropharyngeal erythema, clear nasal discharge, bilateral TM normal, maxillary sinus tenderness  EYES:   pupils equal and reactive, no scleral injection or discharge NECK:  normal ROM, no lymphadenopathy, no meningismus   RESP:  no increased work of breathing, diffuse rhonchi, no wheezing, no rales CVS:   regular rate and rhythm Skin:   warm and dry    UC Treatments / Results  Labs (all labs ordered are listed, but only abnormal results are displayed) Labs Reviewed  RESP PANEL BY RT-PCR (RSV, FLU A&B, COVID)  [id number]    EKG   Radiology No results  found.  Procedures Procedures (including critical care time)  Medications Ordered in UC Medications - No data to display  Initial Impression / Assessment and Plan / UC  Course  I have reviewed the triage vital signs and the nursing notes.  Pertinent labs & imaging results that were available during my care of the patient were reviewed by me and considered in my medical decision making (see chart for details).       Pt is a 56 y.o. female who presents for 1 week and is not improving.  Brittney Hampton is  afebrile here without recent antipyretics. Satting adequately on room air. Overall pt is  non-toxic appearing, well hydrated, without respiratory distress. Pulmonary exam is remarkable for rhonchi that clear with cough.  COVID, influenza and RSV negative.  After shared decision making, we will not pursue chest x-ray at this time as it currently would not change management. Review CXR from November 2023 that showed 7 mm lung nodule. Radiologist recommended follow up CT in 6-12 months.  Pt and son reminded of this recommendation.   Treat acute sinobronchitis with steroids and antibiotics as below. Typical duration of symptoms discussed. Return and ED precautions given and patient voiced understanding. Work note provided.   Discussed MDM, treatment plan and plan for follow-up with patient who agrees with plan.     Final Clinical Impressions(s) / UC Diagnoses   Final diagnoses:  Sinobronchitis     Discharge Instructions      Be sure to follow up with your primary care provider to discuss a getting a CT of your lungs for the  7 mm lung nodule seen on your chest xray in November   Stop by the pharmacy to pick up your prescriptions.  Follow up with your primary care provider as needed.      ED Prescriptions     Medication Sig Dispense Auth. Provider   predniSONE (DELTASONE) 20 MG tablet Take 2 tablets (40 mg total) by mouth daily for 5 days. 10 tablet Tarron Krolak, DO   azithromycin  (ZITHROMAX Z-PAK) 250 MG tablet Take 1 tablet (250 mg total) by mouth daily. Take 2 tablets on day 1. 6 tablet Quamir Willemsen, DO      PDMP not reviewed this encounter.   Lyndee Hensen, DO 08/25/22 1522

## 2022-08-25 NOTE — Telephone Encounter (Signed)
  Chief Complaint: SOB at rest  Symptoms: cough, chest pain and back pain with coughing soreness, fever Frequency: last Sunday Pertinent Negatives: Patient denies wheezing, runny nose Disposition: '[]'$ ED /'[x]'$ Urgent Care (no appt availability in office) / '[]'$ Appointment(In office/virtual)/ '[]'$  [place] [facility]/ '[]'$ Home Care/ '[]'$ Refused Recommended Disposition /'[]'$ [facility]/ '[]'$  Follow-up with PCP Additional Notes: pt refused ED- advised to go to UC- pt is agreeable Reason for Disposition  [1] MODERATE difficulty breathing (e.g., speaks in phrases, SOB even at rest, pulse 100-120) AND [2] NEW-onset or WORSE than normal  Answer Assessment - Initial Assessment Questions 1. RESPIRATORY STATUS: "Describe your breathing?" (e.g., wheezing, shortness of breath, unable to speak, severe coughing)      SOB 2. ONSET: "When did this breathing problem begin?"      Last Sunday 3. PATTERN "Does the difficult breathing come and go, or has it been constant since it started?"      Comes and goes 4. SEVERITY: "How bad is your breathing?" (e.g., mild, moderate, severe)    - MILD: No SOB at rest, mild SOB with walking, speaks normally in sentences, can lie down, no retractions, pulse < 100.    - MODERATE: SOB at rest, SOB with minimal exertion and prefers to sit, cannot lie down flat, speaks in phrases, mild retractions, audible wheezing, pulse 100-120.    - SEVERE: Very SOB at rest, speaks in single words, struggling to breathe, sitting hunched forward, retractions, pulse > 120      moderate 5. RECURRENT SYMPTOM: "Have you had difficulty breathing before?" If Yes, ask: "When was the last time?" and "What happened that time?"      Covid  6. CARDIAC HISTORY: "Do you have any history of heart disease?" (e.g., heart attack, angina, bypass surgery, angioplasty)      *No Answer* 7. LUNG HISTORY: "Do you have any history of lung disease?"  (e.g., pulmonary embolus, asthma, emphysema)     *No Answer* 8.  CAUSE: "What do you think is causing the breathing problem?"      Flu 9. OTHER SYMPTOMS: "Do you have any other symptoms? (e.g., dizziness, runny nose, cough, chest pain, fever)     Cough, fever, chest pain and back  10. O2 SATURATION MONITOR:  "Do you use an oxygen saturation monitor (pulse oximeter) at home?" If Yes, ask: "What is your reading (oxygen level) today?" "What is your usual oxygen saturation reading?" (e.g., 95%)       *No Answer* 11. PREGNANCY: "Is there any chance you are pregnant?" "When was your last menstrual period?"       N/a 12. TRAVEL: "Have you traveled out of the country in the last month?" (e.g., travel history, exposures)       N/a  Protocols used: Breathing Difficulty-A-AH

## 2022-08-25 NOTE — ED Triage Notes (Signed)
Patient c/o sore throat, cough, chest congestion, and fever that started last Sunday.  Patient has been taking Theraflu for her symptoms.  Patient states that her cough and chest congestion has gotten worse.

## 2022-08-25 NOTE — Discharge Instructions (Addendum)
Be sure to follow up with your primary care provider to discuss a getting a CT of your lungs for the  7 mm lung nodule seen on your chest xray in November   Stop by the pharmacy to pick up your prescriptions.  Follow up with your primary care provider as needed.

## 2022-08-27 NOTE — Patient Instructions (Incomplete)
Rash, Adult  A rash is a change in the color of your skin. A rash can also change the way your skin feels. There are many different conditions and factors that can cause a rash. Follow these instructions at home: The goal of treatment is to stop the itching and keep the rash from spreading. Watch for any changes in your symptoms. Let your doctor know about them. Follow these instructions to help with your condition: Medicine Take or apply over-the-counter and prescription medicines only as told by your doctor. These may include medicines: To treat red or swollen skin (corticosteroid creams). To treat itching. To treat an allergy (oral antihistamines). To treat very bad symptoms (oral corticosteroids).  Skin care Put cool cloths (compresses) on the affected areas. Do not scratch or rub your skin. Avoid covering the rash. Make sure that the rash is exposed to air as much as possible. Managing itching and discomfort Avoid hot showers or baths. These can make itching worse. A cold shower may help. Try taking a bath with: Epsom salts. You can get these at your local pharmacy or grocery store. Follow the instructions on the package. Baking soda. Pour a small amount into the bath as told by your doctor. Colloidal oatmeal. You can get this at your local pharmacy or grocery store. Follow the instructions on the package. Try putting baking soda paste onto your skin. Stir water into baking soda until it gets like a paste. Try putting on a lotion that relieves itchiness (calamine lotion). Keep cool and out of the sun. Sweating and being hot can make itching worse. General instructions  Rest as needed. Drink enough fluid to keep your pee (urine) pale yellow. Wear loose-fitting clothing. Avoid scented soaps, detergents, and perfumes. Use gentle soaps, detergents, perfumes, and other cosmetic products. Avoid anything that causes your rash. Keep a journal to help track what causes your rash. Write  down: What you eat. What cosmetic products you use. What you drink. What you wear. This includes jewelry. Keep all follow-up visits as told by your doctor. This is important. Contact a doctor if: You sweat at night. You lose weight. You pee (urinate) more than normal. You pee less than normal, or you notice that your pee is a darker color than normal. You feel weak. You throw up (vomit). Your skin or the whites of your eyes look yellow (jaundice). Your skin: Tingles. Is numb. Your rash: Does not go away after a few days. Gets worse. You are: More thirsty than normal. More tired than normal. You have: New symptoms. Pain in your belly (abdomen). A fever. Watery poop (diarrhea). Get help right away if: You have a fever and your symptoms suddenly get worse. You start to feel mixed up (confused). You have a very bad headache or a stiff neck. You have very bad joint pains or stiffness. You have jerky movements that you cannot control (seizure). Your rash covers all or most of your body. The rash may or may not be painful. You have blisters that: Are on top of the rash. Grow larger. Grow together. Are painful. Are inside your nose or mouth. You have a rash that: Looks like purple pinprick-sized spots all over your body. Has a "bull's eye" or looks like a target. Is red and painful, causes your skin to peel, and is not from being in the sun too long. Summary A rash is a change in the color of your skin. A rash can also change the way your skin   feels. The goal of treatment is to stop the itching and keep the rash from spreading. Take or apply over-the-counter and prescription medicines only as told by your doctor. Contact a doctor if you have new symptoms or symptoms that get worse. Keep all follow-up visits as told by your doctor. This is important. This information is not intended to replace advice given to you by your health care provider. Make sure you discuss any  questions you have with your health care provider. Document Revised: 02/28/2022 Document Reviewed: 06/09/2021 Elsevier Patient Education  2023 [employer] Inc.  

## 2022-08-30 ENCOUNTER — Ambulatory Visit: Payer: Medicaid Other | Admitting: Nurse Practitioner

## 2022-10-04 ENCOUNTER — Ambulatory Visit: Payer: Self-pay | Admitting: *Deleted

## 2022-10-04 NOTE — Telephone Encounter (Signed)
Interpreter:Beth#[id number] Reason for Disposition  Continuous (nonstop) coughing interferes with work, school, or sleeping  Answer Assessment - Initial Assessment Questions 1. RESPIRATORY STATUS: "Describe your breathing?" (e.g., wheezing, shortness of breath, unable to speak, severe coughing)      Cough, wheezing 2. ONSET: "When did this breathing problem begin?"      3-4 days ago 3. PATTERN "Does the difficult breathing come and go, or has it been constant since it started?"      Comes and goes- pressure in chest at night 4. SEVERITY: "How bad is your breathing?" (e.g., mild, moderate, severe)    - MILD: No SOB at rest, mild SOB with walking, speaks normally in sentences, can lie down, no retractions, pulse < 100.    - MODERATE: SOB at rest, SOB with minimal exertion and prefers to sit, cannot lie down flat, speaks in phrases, mild retractions, audible wheezing, pulse 100-120.    - SEVERE: Very SOB at rest, speaks in single words, struggling to breathe, sitting hunched forward, retractions, pulse > 120      Wheezing comes and goes 5. RECURRENT SYMPTOM: "Have you had difficulty breathing before?" If Yes, ask: "When was the last time?" and "What happened that time?"      COVID test- negative, recent treatment 1 month ago- bronchitis- did get better 6. CARDIAC HISTORY: "Do you have any history of heart disease?" (e.g., heart attack, angina, bypass surgery, angioplasty)      no 7. LUNG HISTORY: "Do you have any history of lung disease?"  (e.g., pulmonary embolus, asthma, emphysema)     no 8. CAUSE: "What do you think is causing the breathing problem?"      Cough, congestion 9. OTHER SYMPTOMS: "Do you have any other symptoms? (e.g., dizziness, runny nose, cough, chest pain, fever)     Cough, fever at night, cold sores   12. TRAVEL: "Have you traveled out of the country in the last month?" (e.g., travel history, exposures)       Exposure at work possible  Protocols used: Breathing  Difficulty-A-AH

## 2022-10-04 NOTE — Telephone Encounter (Signed)
  Chief Complaint: cough, chest discomfort Symptoms: cough- chest discomfort with cough, wheezing comes and goes Frequency: on going- patient states she has been treated 3-4 times and she keeps getting the infection back Pertinent Negatives: Patient denies COVID- SOB now Disposition: '[]'$ ED /'[x]'$ Urgent Care (no appt availability in office) / '[]'$ Appointment(In office/virtual)/ '[]'$  [place] [facility]/ '[]'$ Home Care/ '[]'$ Refused Recommended Disposition /'[]'$ [facility] [facility]/ '[]'$  Follow-up with PCP Additional Notes: Patient is concerned about reoccurring respiratory infections- she has been treated multiple times. Patient advised UC today and keep appointment scheduled for follow up. Patient offered office appointment- but can not come due to her work schedule and she is at her limit on missing days.

## 2022-10-04 NOTE — Telephone Encounter (Signed)
Noted  

## 2022-10-07 NOTE — Patient Instructions (Signed)

## 2022-10-10 ENCOUNTER — Encounter: Payer: Self-pay | Admitting: Nurse Practitioner

## 2022-10-10 ENCOUNTER — Ambulatory Visit: Payer: Medicaid Other | Admitting: Nurse Practitioner

## 2022-10-10 VITALS — BP 105/69 | HR 72 | Temp 97.8°F | Ht 65.0 in | Wt 165.2 lb

## 2022-10-10 DIAGNOSIS — R052 Subacute cough: Secondary | ICD-10-CM | POA: Insufficient documentation

## 2022-10-10 DIAGNOSIS — L658 Other specified nonscarring hair loss: Secondary | ICD-10-CM

## 2022-10-10 DIAGNOSIS — E538 Deficiency of other specified B group vitamins: Secondary | ICD-10-CM

## 2022-10-10 DIAGNOSIS — R768 Other specified abnormal immunological findings in serum: Secondary | ICD-10-CM

## 2022-10-10 DIAGNOSIS — R911 Solitary pulmonary nodule: Secondary | ICD-10-CM | POA: Diagnosis not present

## 2022-10-10 DIAGNOSIS — K760 Fatty (change of) liver, not elsewhere classified: Secondary | ICD-10-CM

## 2022-10-10 MED ORDER — PANTOPRAZOLE SODIUM 40 MG PO TBEC: 40.0000 mg | DELAYED_RELEASE_TABLET | Freq: Every day | ORAL | 4 refills | Status: DC

## 2022-10-10 MED ORDER — AMOXICILLIN-POT CLAVULANATE 875-125 MG PO TABS: 1.0000 | ORAL_TABLET | Freq: Two times a day (BID) | ORAL | 0 refills | Status: AC

## 2022-10-10 MED ORDER — LORATADINE 10 MG PO TABS: 10.0000 mg | ORAL_TABLET | Freq: Every day | ORAL | 11 refills | Status: DC

## 2022-10-10 MED ORDER — VALACYCLOVIR HCL 1 G PO TABS: 1000.0000 mg | ORAL_TABLET | Freq: Two times a day (BID) | ORAL | 0 refills | Status: AC

## 2022-10-10 NOTE — Assessment & Plan Note (Signed)
Ongoing, continue collaboration with rheumatology.  Recent note reviewed.

## 2022-10-10 NOTE — Assessment & Plan Note (Signed)
Ongoing.  Noted on MRI.  Continue focus on diet changes, discussed with her.  Provided print out on this to them.  Sclera with no jaundice noted today.  Recheck labs and order for repeat CT scan lungs to check nodule, which will be able to reassess cavernous hemangioma too.  Recommend she follow-up with GI and reschedule colonoscopy, discussed her fears about this procedure.

## 2022-10-10 NOTE — Assessment & Plan Note (Signed)
Ongoing, last labs reassuring, will recheck today.

## 2022-10-10 NOTE — Assessment & Plan Note (Signed)
Chronic, ongoing.   Recommend to continue monthly injections in office. Check level today.

## 2022-10-10 NOTE — Assessment & Plan Note (Signed)
Acute and ongoing symptoms, not 100% as of yet.  Start Augmentin BID for 7 days and Claritin 10 MG daily + restart her Protonix as concern this is being irritated by her chronic reflux.  Recommend: - Increased rest - Increasing Fluids - Acetaminophen as needed for fever/pain.  - Salt water gargling, chloraseptic spray and throat lozenges - Mucinex.  - Humidifying the air.

## 2022-10-10 NOTE — Progress Notes (Signed)
BP 105/69   Pulse 72   Temp 97.8 F (36.6 C) (Oral)   Ht '5\' 5"'$  (1.651 m)   Wt 165 lb 3.2 oz (74.9 kg)   LMP 12/06/2016 (Approximate)   SpO2 96%   BMI 27.49 kg/m    Subjective:    Patient ID: Brittney Hampton, female    DOB: 06/08/1966, 57 y.o.   MRN: [id number]  HPI: Brittney Hampton is a 57 y.o. female  Chief Complaint  Patient presents with   Respiratory Concerns    Has had for 2 weeks ago   eyes yellow    Son told her that her eyes were yellow yesterday and also states that they were dry and finding it hard to see   LUNG CONCERNS She is concerned because she has been sick on and off over past months.  Working at [employer] who is strict, they have provided her termination because she was sick 4 times in a month.  She was seen in UC on 08/25/22 for bronchitis, treated with Azithromycin and Prednisone, felt better for 1 to 2 weeks then felt bad again.  Currently symptoms have returned with coughing, tired, low energy.  Her eyes feel hot, she feels her vision is changing.  Her son reports patient eyes were a little yellow yesterday.  She is scheduled to see neurology on February 6th but needs to reschedule this.    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  Saw rheumatology on 08/30/22 with referral to neurology placed for further assessment. Fever: yes when was sick -- plus has cold sores Cough: yes Shortness of breath: no Wheezing: yes Chest pain: no Chest tightness: yes Chest congestion: yes Nasal congestion: no Runny nose: no Post nasal drip: no Sneezing: no Sore throat: no Swollen glands: no Sinus pressure: no Headache: no Face pain: no Toothache: no Ear pain: mild to both ears Ear pressure: yes bilateral Eyes red/itching:no Eye drainage/crusting: no   Vomiting:  occasional nausea, no emesis Rash: no Fatigue: yes Sick contacts: no Strep contacts: no  Context: fluctuating Recurrent sinusitis: no Relief with OTC cold/cough medications: no  Treatments attempted: as above    Relevant past medical, surgical, family and social history reviewed and updated as indicated. Interim medical history since our last visit reviewed. Allergies and medications reviewed and updated.  Review of Systems  Constitutional:  Positive for fatigue. Negative for activity change, appetite change, chills and fever.  HENT: Negative.    Eyes:  Negative for pain and visual disturbance.  Respiratory:  Positive for cough, chest tightness and wheezing. Negative for shortness of breath.   Cardiovascular:  Negative for chest pain, palpitations and leg swelling.  Gastrointestinal:  Positive for nausea (occasional with heart burn). Negative for abdominal distention, abdominal pain, constipation and diarrhea.  Endocrine: Negative.   Neurological:  Negative for dizziness, numbness and headaches.  Psychiatric/Behavioral: Negative.     Per HPI unless specifically indicated above     Objective:    BP 105/69   Pulse 72   Temp 97.8 F (36.6 C) (Oral)   Ht '5\' 5"'$  (1.651 m)   Wt 165 lb 3.2 oz (74.9 kg)   LMP 12/06/2016 (Approximate)   SpO2 96%   BMI 27.49 kg/m   Wt Readings from Last 3  Encounters:  10/10/22 165 lb 3.2 oz (74.9 kg)  08/25/22 169 lb 1.5 oz (76.7 kg)  08/16/22 169 lb (76.7 kg)    Physical Exam Vitals and nursing note reviewed.  Constitutional:      General: She is awake. She is not in acute distress.    Appearance: She is well-developed and well-groomed. She is not ill-appearing or toxic-appearing.  HENT:     Head: Normocephalic.     Right Ear: Hearing, ear canal and external ear normal. No drainage. A middle ear effusion is present. Tympanic membrane is not injected or perforated.     Left Ear: Hearing, ear canal and external ear normal. No  drainage. A middle ear effusion is present. Tympanic membrane is not injected or perforated.     Nose: Nose normal. No rhinorrhea.     Right Sinus: No maxillary sinus tenderness or frontal sinus tenderness.     Left Sinus: No maxillary sinus tenderness or frontal sinus tenderness.     Mouth/Throat:     Mouth: Mucous membranes are moist. Oral lesions (cold sores to bottom and top of lips) present.     Pharynx: No pharyngeal swelling, oropharyngeal exudate or posterior oropharyngeal erythema.  Eyes:     General: Lids are normal. No scleral icterus.       Right eye: No discharge.        Left eye: No discharge.     Conjunctiva/sclera: Conjunctivae normal.     Pupils: Pupils are equal, round, and reactive to light.  Neck:     Thyroid: No thyromegaly.     Vascular: No carotid bruit.  Cardiovascular:     Rate and Rhythm: Normal rate and regular rhythm.     Heart sounds: Normal heart sounds. No murmur heard.    No gallop.  Pulmonary:     Effort: Pulmonary effort is normal. No accessory muscle usage or respiratory distress.     Breath sounds: Wheezing present. No decreased breath sounds or rhonchi.     Comments: Occasional expiratory wheezes noted throughout. Abdominal:     General: Bowel sounds are normal. There is no distension.     Palpations: Abdomen is soft. There is no hepatomegaly.     Tenderness: There is no abdominal tenderness.  Musculoskeletal:     Cervical back: Normal range of motion and neck supple.     Right lower leg: No edema.     Left lower leg: No edema.  Lymphadenopathy:     Cervical: No cervical adenopathy.  Skin:    General: Skin is warm and dry.  Neurological:     Mental Status: She is alert and oriented to person, place, and time.  Psychiatric:        Attention and Perception: Attention normal.        Mood and Affect: Mood normal.        Speech: Speech normal.        Behavior: Behavior normal. Behavior is cooperative.        Thought Content: Thought content  normal.     Results for orders placed or performed during the hospital encounter of 08/25/22  Resp panel by RT-PCR (RSV, Flu A&B, Covid) Anterior Nasal Swab   Specimen: Anterior Nasal Swab  Result Value Ref Range   SARS Coronavirus 2 by RT PCR NEGATIVE NEGATIVE   Influenza A by PCR NEGATIVE NEGATIVE   Influenza B by PCR NEGATIVE NEGATIVE   Resp Syncytial Virus by PCR NEGATIVE NEGATIVE      Assessment &  Plan:   Problem List Items Addressed This Visit       Respiratory   Nodule of lower lobe of right lung    Noted on imaging 02/15/22 with recommend for repeat in 6 to 12 months.  Will order to recheck this, especially in presence of ongoing illnesses over past months.  Discussed with patient.      Relevant Orders   CT Chest Wo Contrast     Digestive   Hepatic steatosis (Chronic)    Ongoing.  Noted on MRI.  Continue focus on diet changes, discussed with her.  Provided print out on this to them.  Sclera with no jaundice noted today.  Recheck labs and order for repeat CT scan lungs to check nodule, which will be able to reassess cavernous hemangioma too.  Recommend she follow-up with GI and reschedule colonoscopy, discussed her fears about this procedure.      Relevant Orders   Gamma GT     Musculoskeletal and Integument   Female pattern hair loss    Ongoing, last labs reassuring, will recheck today.      Relevant Orders   CBC with Differential/Platelet   Comprehensive metabolic panel   TSH     Other   B12 deficiency (Chronic)    Chronic, ongoing.   Recommend to continue monthly injections in office. Check level today.      Relevant Orders   Vitamin B12   ANA positive    Ongoing, continue collaboration with rheumatology.  Recent note reviewed.      Subacute cough - Primary    Acute and ongoing symptoms, not 100% as of yet.  Start Augmentin BID for 7 days and Claritin 10 MG daily + restart her Protonix as concern this is being irritated by her chronic reflux.   Recommend: - Increased rest - Increasing Fluids - Acetaminophen as needed for fever/pain.  - Salt water gargling, chloraseptic spray and throat lozenges - Mucinex.  - Humidifying the air.       Relevant Orders   Mononucleosis screen     Follow up plan: Return in about 4 weeks (around 11/07/2022) for [place].

## 2022-10-10 NOTE — Assessment & Plan Note (Signed)
Noted on imaging 02/15/22 with recommend for repeat in 6 to 12 months.  Will order to recheck this, especially in presence of ongoing illnesses over past months.  Discussed with patient.

## 2022-10-11 LAB — CBC WITH DIFFERENTIAL/PLATELET
Basophils Absolute: 0 10*3/uL (ref 0.0–0.2)
Basos: 1 %
EOS (ABSOLUTE): 0.2 10*3/uL (ref 0.0–0.4)
Eos: 3 %
Hematocrit: 43.1 % (ref 34.0–46.6)
Hemoglobin: 14.3 g/dL (ref 11.1–15.9)
Immature Grans (Abs): 0 10*3/uL (ref 0.0–0.1)
Immature Granulocytes: 0 %
Lymphocytes Absolute: 2.1 10*3/uL (ref 0.7–3.1)
Lymphs: 46 %
MCH: 29.8 pg (ref 26.6–33.0)
MCHC: 33.2 g/dL (ref 31.5–35.7)
MCV: 90 fL (ref 79–97)
Monocytes Absolute: 0.2 10*3/uL (ref 0.1–0.9)
Monocytes: 5 %
Neutrophils Absolute: 2 10*3/uL (ref 1.4–7.0)
Neutrophils: 45 %
Platelets: 293 10*3/uL (ref 150–450)
RBC: 4.8 x10E6/uL (ref 3.77–5.28)
RDW: 12.2 % (ref 11.7–15.4)
WBC: 4.5 10*3/uL (ref 3.4–10.8)

## 2022-10-11 LAB — COMPREHENSIVE METABOLIC PANEL
ALT: 16 IU/L (ref 0–32)
AST: 17 IU/L (ref 0–40)
Albumin/Globulin Ratio: 1.6 (ref 1.2–2.2)
Albumin: 4.2 g/dL (ref 3.8–4.9)
Alkaline Phosphatase: 81 IU/L (ref 44–121)
BUN/Creatinine Ratio: 14 (ref 9–23)
BUN: 10 mg/dL (ref 6–24)
Bilirubin Total: 0.6 mg/dL (ref 0.0–1.2)
CO2: 24 mmol/L (ref 20–29)
Calcium: 10 mg/dL (ref 8.7–10.2)
Chloride: 106 mmol/L (ref 96–106)
Creatinine, Ser: 0.7 mg/dL (ref 0.57–1.00)
Globulin, Total: 2.7 g/dL (ref 1.5–4.5)
Glucose: 130 mg/dL — ABNORMAL HIGH (ref 70–99)
Potassium: 4 mmol/L (ref 3.5–5.2)
Sodium: 145 mmol/L — ABNORMAL HIGH (ref 134–144)
Total Protein: 6.9 g/dL (ref 6.0–8.5)
eGFR: 103 mL/min/{1.73_m2} (ref >59)

## 2022-10-11 LAB — GAMMA GT: GGT: 18 IU/L (ref 0–60)

## 2022-10-11 LAB — MONONUCLEOSIS SCREEN: Mono Screen: NEGATIVE

## 2022-10-11 LAB — VITAMIN B12: Vitamin B-12: 1035 pg/mL (ref 232–1245)

## 2022-10-11 LAB — TSH: TSH: 1.84 u[IU]/mL (ref 0.450–4.500)

## 2022-10-11 NOTE — Progress Notes (Signed)
Contacted via [place] afternoon Brittney Hampton, your labs have returned.  Sugar is a little elevated this check, at next visit I will check A1c again to ensure no diabetes presenting.  Sodium level a little elevated, please cut back on salt intake and increase water intake.   - Kidney function, creatinine and eGFR, remains normal, as is liver function, AST and ALT.  - Remainder of labs all in good ranges.  Any questions? Keep being amazing!!  Thank you for allowing me to participate in your care.  I appreciate you. Kindest regards, Shafiq Larch

## 2022-10-13 ENCOUNTER — Telehealth: Payer: Self-pay

## 2022-10-13 ENCOUNTER — Telehealth: Payer: Self-pay | Admitting: Nurse Practitioner

## 2022-10-13 NOTE — Telephone Encounter (Signed)
Pt given lab results per notes of Jolene, NP on 10/13/22. Pt verbalized understanding.

## 2022-10-13 NOTE — Telephone Encounter (Signed)
Copied from [place]. Topic: General - Other >> Oct 13, 2022 11:13 AM Chapman Fitch wrote: Reason for CRM: Pt returned call to discuss labs/ please advise

## 2022-10-25 ENCOUNTER — Ambulatory Visit: Payer: Medicaid Other | Attending: Nurse Practitioner

## 2022-11-05 NOTE — Patient Instructions (Signed)

## 2022-11-10 ENCOUNTER — Encounter: Payer: Self-pay | Admitting: Nurse Practitioner

## 2022-11-10 ENCOUNTER — Ambulatory Visit: Payer: Medicaid Other | Admitting: Nurse Practitioner

## 2022-11-10 VITALS — BP 121/77 | HR 62 | Temp 97.7°F | Ht 65.0 in | Wt 174.8 lb

## 2022-11-10 DIAGNOSIS — R052 Subacute cough: Secondary | ICD-10-CM | POA: Diagnosis not present

## 2022-11-10 DIAGNOSIS — L918 Other hypertrophic disorders of the skin: Secondary | ICD-10-CM | POA: Diagnosis not present

## 2022-11-10 MED ORDER — LEVOCETIRIZINE DIHYDROCHLORIDE 5 MG PO TABS: 5.0000 mg | ORAL_TABLET | Freq: Every evening | ORAL | 4 refills | Status: DC

## 2022-11-10 MED ORDER — MUPIROCIN 2 % EX OINT: 1.0000 | TOPICAL_OINTMENT | Freq: Two times a day (BID) | CUTANEOUS | 0 refills | Status: DC

## 2022-11-10 MED ORDER — OLOPATADINE HCL 0.2 % OP SOLN: 1.0000 [drp] | Freq: Every day | OPHTHALMIC | 3 refills | Status: DC

## 2022-11-10 NOTE — Progress Notes (Signed)
BP 121/77   Pulse 62   Temp 97.7 F (36.5 C) (Oral)   Ht '5\' 5"'$  (1.651 m)   Wt 174 lb 12.8 oz (79.3 kg)   LMP 12/06/2016 (Approximate)   SpO2 97%   BMI 29.09 kg/m    Subjective:    Patient ID: Brittney Hampton, female    DOB: July 13, 1966, 57 y.o.   MRN: [id number]  HPI: Brittney Hampton is a 57 y.o. female  Chief Complaint  Patient presents with   Lung Check    Cough seems to be getting better    Abnormal mole     On left fore arm Noticed 2 weeks ago   COUGH Follow-up for cough today, treated with Augmentin on 10/10/22.  Due for repeat chest imaging to check on lung nodule noted on CT June 2023 and noted again on x-ray November 2023 -- was scheduled for CT repeat, but lost appointment due to needing to work.  Lost her job at [employer].  Cough is a little better, but still present.  Has Albuterol inhaler, but makes her nose dry.  Taking Claritin 10 MG daily + Protonix for heart burn.  Saw rheumatology on 08/30/22 with referral to neurology placed for further assessment.  Duration: days Circumstances of initial development of cough: URI Cough severity: mild Cough description: productive Aggravating factors:  nothing and exercise Alleviating factors:  taking deep breaths Status:  fluctuating Treatments attempted: cold/sinus, mucinex, and antibiotics Wheezing: no Shortness of breath: no Chest pain: no Chest tightness:no Nasal congestion: no Runny nose: no Postnasal drip: no Frequent throat clearing or swallowing: no Hemoptysis: no Fevers: no Night sweats: no Weight loss: no Heartburn: no Recent foreign travel: no Tuberculosis contacts: no   SKIN LESION Showed up to left forearm two weeks ago.  Scratches at it sometimes.   Duration: weeks Location: left forearm Painful: no Itching: no Onset: sudden Context: not changing Associated signs and symptoms: none History of skin cancer: no History of precancerous skin lesions: no Family history of skin  cancer: no   Relevant past medical, surgical, family and social history reviewed and updated as indicated. Interim medical history since our last visit reviewed. Allergies and medications reviewed and updated.  Review of Systems  Constitutional:  Positive for fatigue. Negative for activity change, appetite change, chills and fever.  HENT:  Positive for postnasal drip. Negative for rhinorrhea.   Eyes:  Negative for pain and visual disturbance.  Respiratory:  Positive for cough (improving). Negative for chest tightness, shortness of breath and wheezing.   Cardiovascular:  Negative for chest pain, palpitations and leg swelling.  Gastrointestinal: Negative.   Neurological:  Negative for dizziness, numbness and headaches.  Psychiatric/Behavioral: Negative.      Per HPI unless specifically indicated above     Objective:    BP 121/77   Pulse 62   Temp 97.7 F (36.5 C) (Oral)   Ht '5\' 5"'$  (1.651 m)   Wt 174 lb 12.8 oz (79.3 kg)   LMP 12/06/2016 (Approximate)   SpO2 97%   BMI 29.09 kg/m   Wt Readings from Last 3 Encounters:  11/10/22 174 lb 12.8 oz (79.3 kg)  10/10/22 165 lb 3.2 oz (74.9 kg)  08/25/22 169 lb 1.5 oz (76.7 kg)    Physical Exam Vitals and nursing note reviewed.  Constitutional:      General: She is awake. She is not in acute distress.    Appearance: She is well-developed and well-groomed. She is not ill-appearing or toxic-appearing.  HENT:     Head: Normocephalic.     Right Ear: Hearing, ear canal and external ear normal. No drainage. No middle ear effusion. Tympanic membrane is not injected or perforated.     Left Ear: Hearing, ear canal and external ear normal. No drainage.  No middle ear effusion. Tympanic membrane is not injected or perforated.     Nose: Nose normal. No rhinorrhea.     Right Sinus: No maxillary sinus tenderness or frontal sinus tenderness.     Left Sinus: No maxillary sinus tenderness or frontal sinus tenderness.     Mouth/Throat:     Mouth:  Mucous membranes are moist. No oral lesions.     Pharynx: No pharyngeal swelling, oropharyngeal exudate or posterior oropharyngeal erythema.  Eyes:     General: Lids are normal. No scleral icterus.       Right eye: No discharge.        Left eye: No discharge.     Conjunctiva/sclera: Conjunctivae normal.     Pupils: Pupils are equal, round, and reactive to light.  Neck:     Thyroid: No thyromegaly.     Vascular: No carotid bruit.  Cardiovascular:     Rate and Rhythm: Normal rate and regular rhythm.     Heart sounds: Normal heart sounds. No murmur heard.    No gallop.  Pulmonary:     Effort: Pulmonary effort is normal. No accessory muscle usage or respiratory distress.     Breath sounds: No decreased breath sounds, wheezing or rhonchi.  Abdominal:     General: Bowel sounds are normal. There is no distension.     Palpations: Abdomen is soft. There is no hepatomegaly.     Tenderness: There is no abdominal tenderness.  Musculoskeletal:     Cervical back: Normal range of motion and neck supple.     Right lower leg: No edema.     Left lower leg: No edema.  Lymphadenopathy:     Cervical: No cervical adenopathy.  Skin:    General: Skin is warm and dry.       Neurological:     Mental Status: She is alert and oriented to person, place, and time.  Psychiatric:        Attention and Perception: Attention normal.        Mood and Affect: Mood normal.        Speech: Speech normal.        Behavior: Behavior normal. Behavior is cooperative.        Thought Content: Thought content normal.     Results for orders placed or performed in visit on 10/10/22  Mononucleosis screen  Result Value Ref Range   Mono Screen Negative Negative  CBC with Differential/Platelet  Result Value Ref Range   WBC 4.5 3.4 - 10.8 x10E3/uL   RBC 4.80 3.77 - 5.28 x10E6/uL   Hemoglobin 14.3 11.1 - 15.9 g/dL   Hematocrit 43.1 34.0 - 46.6 %   MCV 90 79 - 97 fL   MCH 29.8 26.6 - 33.0 pg   MCHC 33.2 31.5 - 35.7  g/dL   RDW 12.2 11.7 - 15.4 %   Platelets 293 150 - 450 x10E3/uL   Neutrophils 45 Not Estab. %   Lymphs 46 Not Estab. %   Monocytes 5 Not Estab. %   Eos 3 Not Estab. %   Basos 1 Not Estab. %   Neutrophils Absolute 2.0 1.4 - 7.0 x10E3/uL   Lymphocytes Absolute 2.1 0.7 - 3.1 x10E3/uL  Monocytes Absolute 0.2 0.1 - 0.9 x10E3/uL   EOS (ABSOLUTE) 0.2 0.0 - 0.4 x10E3/uL   Basophils Absolute 0.0 0.0 - 0.2 x10E3/uL   Immature Granulocytes 0 Not Estab. %   Immature Grans (Abs) 0.0 0.0 - 0.1 x10E3/uL  Comprehensive metabolic panel  Result Value Ref Range   Glucose 130 (H) 70 - 99 mg/dL   BUN 10 6 - 24 mg/dL   Creatinine, Ser 0.70 0.57 - 1.00 mg/dL   eGFR 103 >59 mL/min/1.73   BUN/Creatinine Ratio 14 9 - 23   Sodium 145 (H) 134 - 144 mmol/L   Potassium 4.0 3.5 - 5.2 mmol/L   Chloride 106 96 - 106 mmol/L   CO2 24 20 - 29 mmol/L   Calcium 10.0 8.7 - 10.2 mg/dL   Total Protein 6.9 6.0 - 8.5 g/dL   Albumin 4.2 3.8 - 4.9 g/dL   Globulin, Total 2.7 1.5 - 4.5 g/dL   Albumin/Globulin Ratio 1.6 1.2 - 2.2   Bilirubin Total 0.6 0.0 - 1.2 mg/dL   Alkaline Phosphatase 81 44 - 121 IU/L   AST 17 0 - 40 IU/L   ALT 16 0 - 32 IU/L  TSH  Result Value Ref Range   TSH 1.840 0.450 - 4.500 uIU/mL  Vitamin B12  Result Value Ref Range   Vitamin B-12 1,035 232 - 1,245 pg/mL  Gamma GT  Result Value Ref Range   GGT 18 0 - 60 IU/L      Assessment & Plan:   Problem List Items Addressed This Visit       Other   Subacute cough - Primary    Acute and improving.  Will stop Claritin and start Xyzal as more allergy symptoms at this time + add on Pataday for eye allergy irritation.        Other Visit Diagnoses     Inflamed skin tag       To left forearm -- recommend no picking at area.  Mupirocin sent in.        Follow up plan: Return for reschedule her March 27th physical to a slot that is available after this date after 4PM.

## 2022-11-10 NOTE — Assessment & Plan Note (Signed)
Acute and improving.  Will stop Claritin and start Xyzal as more allergy symptoms at this time + add on Pataday for eye allergy irritation.

## 2022-12-06 ENCOUNTER — Encounter: Payer: Medicaid Other | Admitting: Nurse Practitioner

## 2022-12-18 ENCOUNTER — Encounter: Payer: Medicaid Other | Admitting: Nurse Practitioner

## 2022-12-18 DIAGNOSIS — E559 Vitamin D deficiency, unspecified: Secondary | ICD-10-CM

## 2022-12-18 DIAGNOSIS — Z0000 Encounter for general adult medical examination without abnormal findings: Secondary | ICD-10-CM

## 2022-12-18 DIAGNOSIS — D1803 Hemangioma of intra-abdominal structures: Secondary | ICD-10-CM

## 2022-12-18 DIAGNOSIS — I700 Atherosclerosis of aorta: Secondary | ICD-10-CM

## 2022-12-18 DIAGNOSIS — E785 Hyperlipidemia, unspecified: Secondary | ICD-10-CM

## 2022-12-18 DIAGNOSIS — K219 Gastro-esophageal reflux disease without esophagitis: Secondary | ICD-10-CM

## 2022-12-18 DIAGNOSIS — K760 Fatty (change of) liver, not elsewhere classified: Secondary | ICD-10-CM

## 2022-12-18 DIAGNOSIS — R7301 Impaired fasting glucose: Secondary | ICD-10-CM

## 2022-12-18 DIAGNOSIS — R768 Other specified abnormal immunological findings in serum: Secondary | ICD-10-CM

## 2022-12-18 DIAGNOSIS — Z1231 Encounter for screening mammogram for malignant neoplasm of breast: Secondary | ICD-10-CM

## 2022-12-18 DIAGNOSIS — M8580 Other specified disorders of bone density and structure, unspecified site: Secondary | ICD-10-CM

## 2022-12-18 DIAGNOSIS — Z124 Encounter for screening for malignant neoplasm of cervix: Secondary | ICD-10-CM

## 2022-12-18 DIAGNOSIS — R911 Solitary pulmonary nodule: Secondary | ICD-10-CM

## 2023-01-03 ENCOUNTER — Ambulatory Visit: Payer: Medicaid Other | Admitting: Nurse Practitioner

## 2023-01-10 ENCOUNTER — Ambulatory Visit: Payer: Medicaid Other | Admitting: Nurse Practitioner

## 2023-01-16 ENCOUNTER — Encounter: Payer: Medicaid Other | Admitting: Nurse Practitioner

## 2023-02-14 ENCOUNTER — Encounter: Payer: Medicaid Other | Admitting: Nurse Practitioner

## 2023-02-14 DIAGNOSIS — Z124 Encounter for screening for malignant neoplasm of cervix: Secondary | ICD-10-CM

## 2023-02-14 DIAGNOSIS — I700 Atherosclerosis of aorta: Secondary | ICD-10-CM

## 2023-02-14 DIAGNOSIS — Z0000 Encounter for general adult medical examination without abnormal findings: Secondary | ICD-10-CM

## 2023-02-14 DIAGNOSIS — Z1231 Encounter for screening mammogram for malignant neoplasm of breast: Secondary | ICD-10-CM

## 2023-02-14 DIAGNOSIS — E785 Hyperlipidemia, unspecified: Secondary | ICD-10-CM

## 2023-02-14 DIAGNOSIS — E559 Vitamin D deficiency, unspecified: Secondary | ICD-10-CM

## 2023-02-14 DIAGNOSIS — E538 Deficiency of other specified B group vitamins: Secondary | ICD-10-CM

## 2023-02-14 DIAGNOSIS — K219 Gastro-esophageal reflux disease without esophagitis: Secondary | ICD-10-CM

## 2023-02-14 DIAGNOSIS — D1803 Hemangioma of intra-abdominal structures: Secondary | ICD-10-CM

## 2023-02-14 DIAGNOSIS — M2559 Pain in other specified joint: Secondary | ICD-10-CM

## 2023-02-14 DIAGNOSIS — R768 Other specified abnormal immunological findings in serum: Secondary | ICD-10-CM

## 2023-02-14 DIAGNOSIS — M8580 Other specified disorders of bone density and structure, unspecified site: Secondary | ICD-10-CM

## 2023-02-14 DIAGNOSIS — R7301 Impaired fasting glucose: Secondary | ICD-10-CM

## 2023-02-14 DIAGNOSIS — K760 Fatty (change of) liver, not elsewhere classified: Secondary | ICD-10-CM

## 2023-02-21 ENCOUNTER — Ambulatory Visit: Payer: Self-pay

## 2023-02-21 NOTE — Telephone Encounter (Signed)
  Chief Complaint: hand pain Symptoms: finger pain d/t darkened area, redness, painful and swelling, pt couldn't describe which finger was affected Frequency: 1 week Pertinent Negatives: NA Disposition: [] ED /[x] Urgent Care (no appt availability in office) / [] Appointment(In office/virtual)/ []  [place] [facility]/ [] Home Care/ [] Refused Recommended Disposition /[] [place] [facility]/ []  Follow-up with PCP Additional Notes: no appts today with practice, offered OV for tomorrow but pt unable to come d/t she doesn't get off work until [employer]. Pt preferred to schedule UC appt for tomorrow at 1600.   Summary: Dark spot on finger, no appts soon enough   Patient wants an appt however she declined all of the next available slots, she says she has a dark spot on her finger that is causing her pain. Best contact: [phone]         Reason for Disposition  [1] MODERATE pain (e.g., interferes with normal activities) AND [2] present > 3 days  Answer Assessment - Initial Assessment Questions 1. ONSET: "When did the pain start?"      1 week 2. LOCATION and RADIATION: "Where is the pain located?"  (e.g., fingertip, around nail, joint, entire  finger)      Per pt hard to describe 3. SEVERITY: "How bad is the pain?" "What does it keep you from doing?"   (Scale 1-10; or mild, moderate, severe)  - MILD (1-3): doesn't interfere with normal activities.   - MODERATE (4-7): interferes with normal activities or awakens from sleep.  - SEVERE (8-10): excruciating pain, unable to hold a glass of water or bend finger even a little.     8 4. APPEARANCE: "What does the finger look like?" (e.g., redness, swelling, bruising, pallor)     Redness and swelling  8. OTHER SYMPTOMS: "Do you have any other symptoms?" (e.g., fever, neck pain, numbness)  Protocols used: Finger Pain-A-AH

## 2023-02-22 ENCOUNTER — Ambulatory Visit: Payer: Self-pay

## 2023-02-23 NOTE — Telephone Encounter (Signed)
Called and scheduled appointment on 03/28/2023 @ 9:40 am.

## 2023-03-28 ENCOUNTER — Encounter: Payer: Medicaid Other | Admitting: Nurse Practitioner

## 2023-03-28 DIAGNOSIS — K760 Fatty (change of) liver, not elsewhere classified: Secondary | ICD-10-CM

## 2023-03-28 DIAGNOSIS — I700 Atherosclerosis of aorta: Secondary | ICD-10-CM

## 2023-03-28 DIAGNOSIS — R7301 Impaired fasting glucose: Secondary | ICD-10-CM

## 2023-03-28 DIAGNOSIS — E785 Hyperlipidemia, unspecified: Secondary | ICD-10-CM

## 2023-03-28 DIAGNOSIS — Z124 Encounter for screening for malignant neoplasm of cervix: Secondary | ICD-10-CM

## 2023-03-28 DIAGNOSIS — K219 Gastro-esophageal reflux disease without esophagitis: Secondary | ICD-10-CM

## 2023-03-28 DIAGNOSIS — D1803 Hemangioma of intra-abdominal structures: Secondary | ICD-10-CM

## 2023-03-28 DIAGNOSIS — R768 Other specified abnormal immunological findings in serum: Secondary | ICD-10-CM

## 2023-03-28 DIAGNOSIS — Z0000 Encounter for general adult medical examination without abnormal findings: Secondary | ICD-10-CM

## 2023-03-28 DIAGNOSIS — M8580 Other specified disorders of bone density and structure, unspecified site: Secondary | ICD-10-CM

## 2023-03-28 DIAGNOSIS — Z1231 Encounter for screening mammogram for malignant neoplasm of breast: Secondary | ICD-10-CM

## 2023-03-28 DIAGNOSIS — E538 Deficiency of other specified B group vitamins: Secondary | ICD-10-CM

## 2023-05-06 ENCOUNTER — Ambulatory Visit
Admission: EM | Admit: 2023-05-06 | Discharge: 2023-05-06 | Disposition: A | Discharge status: Home / Self Care | Payer: Medicaid Other | Attending: Emergency Medicine | Admitting: Emergency Medicine

## 2023-05-06 DIAGNOSIS — Z1152 Encounter for screening for COVID-19: Secondary | ICD-10-CM | POA: Diagnosis not present

## 2023-05-06 DIAGNOSIS — K219 Gastro-esophageal reflux disease without esophagitis: Secondary | ICD-10-CM | POA: Insufficient documentation

## 2023-05-06 DIAGNOSIS — K760 Fatty (change of) liver, not elsewhere classified: Secondary | ICD-10-CM | POA: Insufficient documentation

## 2023-05-06 DIAGNOSIS — B9789 Other viral agents as the cause of diseases classified elsewhere: Secondary | ICD-10-CM | POA: Insufficient documentation

## 2023-05-06 DIAGNOSIS — R059 Cough, unspecified: Secondary | ICD-10-CM | POA: Diagnosis present

## 2023-05-06 DIAGNOSIS — M8580 Other specified disorders of bone density and structure, unspecified site: Secondary | ICD-10-CM | POA: Diagnosis not present

## 2023-05-06 DIAGNOSIS — J069 Acute upper respiratory infection, unspecified: Secondary | ICD-10-CM | POA: Diagnosis not present

## 2023-05-06 DIAGNOSIS — I700 Atherosclerosis of aorta: Secondary | ICD-10-CM | POA: Diagnosis not present

## 2023-05-06 DIAGNOSIS — E785 Hyperlipidemia, unspecified: Secondary | ICD-10-CM | POA: Insufficient documentation

## 2023-05-06 LAB — SARS CORONAVIRUS 2 BY RT PCR: SARS Coronavirus 2 by RT PCR: NEGATIVE

## 2023-05-06 LAB — GROUP A STREP BY PCR: Group A Strep by PCR: NOT DETECTED

## 2023-05-06 MED ORDER — IPRATROPIUM BROMIDE 0.06 % NA SOLN: 2.0000 | Freq: Four times a day (QID) | NASAL | 12 refills | Status: DC

## 2023-05-06 MED ORDER — BENZONATATE 100 MG PO CAPS: 200.0000 mg | ORAL_CAPSULE | Freq: Three times a day (TID) | ORAL | 0 refills | Status: DC

## 2023-05-06 MED ORDER — PROMETHAZINE-DM 6.25-15 MG/5ML PO SYRP: 5.0000 mL | ORAL_SOLUTION | Freq: Four times a day (QID) | ORAL | 0 refills | Status: DC | PRN

## 2023-05-06 NOTE — Discharge Instructions (Addendum)
La prueba que se hizo hoy no revel la presencia de estreptococo ni de COVID. Creo, en funcin de sus sntomas, que tiene una infeccin respiratoria viral.  Use Tylenol y/o ibuprofeno de venta libre segn las instrucciones del paquete segn sea necesario para el dolor o la fiebre.  Descanse y beba muchos lquidos para mantenerse hidratado y ayudar a que su cuerpo se recupere.  Use el aerosol nasal Atrovent, 2 chorros en cada fosa nasal cada 6 horas, segn sea necesario para la secrecin nasal y Tax adviser.  Use Tessalon Perles cada 8 horas durante el da. Tmelos con un pequeo sorbo de agua. Es posible que le produzcan algo de entumecimiento en la base de la lengua o un sabor metlico en la boca, esto es normal.  Use el jarabe para la tos Promethazine DM antes de acostarse para la tos y Sales executive. Le producir somnolencia, as que no lo tome Administrator.  Regrese para una reevaluacin o consulte a su mdico de cabecera si tiene sntomas nuevos o que empeoran.  Your testing today did not reveal the presence of either strep or COVID.  I do believe, based upon your symptoms, that you have a viral respiratory infection.  Use over-the-counter Tylenol and/or ibuprofen according to the package instructions as needed for pain or fever.  Rest and drink plenty of fluids so that you maintain hydration and help your body heal.  Use the Atrovent nasal spray, 2 squirts in each nostril every 6 hours, as needed for runny nose and postnasal drip.  Use the Tessalon Perles every 8 hours during the day.  Take them with a small sip of water.  They may give you some numbness to the base of your tongue or a metallic taste in your mouth, this is normal.  Use the Promethazine DM cough syrup at bedtime for cough and congestion.  It will make you drowsy so do not take it during the day.  Return for reevaluation or see your primary care provider for any new or worsening symptoms.

## 2023-05-06 NOTE — ED Provider Notes (Signed)
[facility]    CSN: [id number] Arrival date & time: 05/06/23  1037      History   Chief Complaint Chief Complaint  Patient presents with   Cough   Fever   Sore Throat   Headache    HPI Brittney Hampton is a 57 y.o. female.   HPI  57 year old Spanish-speaking female with a past medical history significant for hepatic hemangioma, and a positive, osteopenia, GERD, hyperlipidemia, hepatic steatosis, aortic atherosclerosis presents for evaluation of bodyaches, headache, sore throat, subjective fever, and nonproductive cough that began last night.  She reports that she has been feeling fatigued for the past week.  She also endorses some nasal congestion and runny nose.  No wheezing, vomiting, or diarrhea.  She has no recent travel but she does work as a Production designer, theatre/television/film in [employer] so she is unsure of sick contacts.  Spanish interpreter Jesus [phone] used for history and physical.  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   Chronic pain of both knees 06/07/2022   Lumbar spondylolysis 02/25/2022   Osteopenia determined by x-ray 01/27/2022   Nodule of lower lobe of right lung 01/26/2022   Vitamin D deficiency 10/25/2021   Varicose veins of both lower extremities with pain 10/24/2021   GERD (gastroesophageal reflux disease) 10/24/2021   History of kidney stones 08/16/2021   Mild hyperlipidemia 08/16/2021   Insomnia 06/20/2021   Hepatic hemangioma 04/30/2021   Hepatic steatosis 04/30/2021   ANA positive 04/04/2021   B12 deficiency 04/02/2021   Joint pain 04/01/2021   IFG (impaired fasting glucose) 04/01/2021   Financial difficulties  04/01/2021   Aortic atherosclerosis (HCC) 03/31/2021    Past Surgical History:  Procedure Laterality Date   ABDOMINAL SURGERY     CESAREAN SECTION      OB History   No obstetric history on file.      Home Medications    Prior to Admission medications   Medication Sig Start Date End Date Taking? Authorizing Provider  benzonatate (TESSALON) 100 MG capsule Take 2 capsules (200 mg total) by mouth every 8 (eight) hours. 05/06/23  Yes Becky Augusta, NP  ipratropium (ATROVENT) 0.06 % nasal spray Place 2 sprays into both nostrils 4 (four) times daily. 05/06/23  Yes Becky Augusta, NP  promethazine-dextromethorphan (PROMETHAZINE-DM) 6.25-15 MG/5ML syrup Take 5 mLs by mouth 4 (four) times daily as needed. 05/06/23  Yes Becky Augusta, NP  amitriptyline (ELAVIL) 25 MG tablet Take 2 tablets (50 mg total) by mouth at bedtime. 05/20/21   Cannady, Corrie Dandy T, NP  Cholecalciferol 1.25 MG (50000 UT) TABS Take 1 tablet by mouth once a week. 12/05/21   Cannady, Corrie Dandy T, NP  levocetirizine (XYZAL) 5 MG tablet Take 1 tablet (5 mg total) by mouth every evening. 11/10/22   Cannady, Corrie Dandy T, NP  methocarbamol (ROBAXIN) 500 MG tablet Take 1 tablet (500 mg total) by mouth 2 (two) times daily. 07/16/22   Katha Cabal, DO  Multiple Vitamins-Minerals (ZINC PO) Take by mouth.    [provider]  mupirocin ointment (BACTROBAN) 2 %  Apply 1 Application topically 2 (two) times daily. 11/10/22   Cannady, Corrie Dandy T, NP  naproxen (NAPROSYN) 500 MG tablet Take 1 tablet (500 mg total) by mouth 2 (two) times daily with a meal. 07/16/22   Brimage, Vondra, DO  Olopatadine HCl 0.2 % SOLN Apply 1 drop to eye daily. Both eyes. 11/10/22   Cannady, Corrie Dandy T, NP  pantoprazole (PROTONIX) 40 MG tablet Take 1 tablet (40 mg total) by mouth daily. 10/10/22   Cannady, Corrie Dandy T, NP  rizatriptan (MAXALT) 5 MG tablet Take 1 tablet (5 mg total) by mouth as needed for migraine. May repeat in 2 hours if needed 04/01/21   Aura Dials T, NP   triamcinolone cream (KENALOG) 0.1 % Apply 1 Application topically 2 (two) times daily. 07/05/22   Cannady, Corrie Dandy T, NP  vitamin B-12 (CYANOCOBALAMIN) 1000 MCG tablet Take 1 tablet (1,000 mcg total) by mouth daily. 10/25/21   Cannady, Corrie Dandy T, NP  VITAMIN E PO Take by mouth.    [provider]    Family History Family History  Problem Relation Age of Onset   Osteoarthritis Mother    Diabetes Father    Cancer Brother     Social History Social History   Tobacco Use   Smoking status: Never   Smokeless tobacco: Never  Vaping Use   Vaping status: Never Used  Substance Use Topics   Alcohol use: Never   Drug use: Never     Allergies   Patient has no known allergies.   Review of Systems Review of Systems  Constitutional:  Positive for fever.  HENT:  Positive for congestion, rhinorrhea and sore throat. Negative for ear pain.   Respiratory:  Positive for cough and shortness of breath. Negative for wheezing.   Gastrointestinal:  Positive for nausea. Negative for diarrhea and vomiting.  Musculoskeletal:  Positive for arthralgias and myalgias.  Neurological:  Positive for headaches.     Physical Exam Triage Vital Signs ED Triage Vitals  Encounter Vitals Group     BP 05/06/23 1152 135/79     Systolic BP Percentile --      Diastolic BP Percentile --      Pulse Rate 05/06/23 1152 (!) 55     Resp 05/06/23 1152 17     Temp 05/06/23 1152 97.8 F (36.6 C)     Temp Source 05/06/23 1152 Oral     SpO2 05/06/23 1152 98 %     Weight 05/06/23 1149 162 lb (73.5 kg)     Height --      Head Circumference --      Peak Flow --      Pain Score 05/06/23 1149 8     Pain Loc --      Pain Education --      Exclude from Growth Chart --    No data found.  Updated Vital Signs BP 135/79 (BP Location: Right Arm)   Pulse (!) 55   Temp 97.8 F (36.6 C) (Oral)   Resp 17   Wt 162 lb (73.5 kg)   LMP 12/06/2016 (Approximate)   SpO2 98%   BMI 26.96 kg/m   Visual Acuity Right  Eye Distance:   Left Eye Distance:   Bilateral Distance:    Right Eye Near:   Left Eye Near:    Bilateral Near:     Physical Exam Vitals and nursing note reviewed.  Constitutional:      Appearance: Normal appearance. She is ill-appearing.  HENT:  Head: Normocephalic and atraumatic.     Right Ear: Tympanic membrane, ear canal and external ear normal. There is no impacted cerumen.     Left Ear: Tympanic membrane, ear canal and external ear normal. There is no impacted cerumen.     Nose: Congestion and rhinorrhea present.     Comments: Nasal mucosa is erythematous and edematous with clear discharge in both nares.    Mouth/Throat:     Mouth: Mucous membranes are moist.     Pharynx: Oropharynx is clear. Posterior oropharyngeal erythema present. No oropharyngeal exudate.     Comments: Tonsillar pillars are mildly erythematous but no significant edema noted.  Posterior oropharynx does demonstrate erythema with mild injection and postnasal drip. Cardiovascular:     Rate and Rhythm: Normal rate and regular rhythm.     Pulses: Normal pulses.     Heart sounds: Normal heart sounds. No murmur heard.    No friction rub. No gallop.  Pulmonary:     Effort: Pulmonary effort is normal.     Breath sounds: Normal breath sounds. No wheezing, rhonchi or rales.  Musculoskeletal:     Cervical back: Normal range of motion and neck supple.  Lymphadenopathy:     Cervical: No cervical adenopathy.  Skin:    General: Skin is warm and dry.     Capillary Refill: Capillary refill takes less than 2 seconds.     Findings: No erythema or rash.  Neurological:     General: No focal deficit present.     Mental Status: She is alert and oriented to person, place, and time.      UC Treatments / Results  Labs (all labs ordered are listed, but only abnormal results are displayed) Labs Reviewed  SARS CORONAVIRUS 2 BY RT PCR  GROUP A STREP BY PCR    EKG   Radiology No results  found.  Procedures Procedures (including critical care time)  Medications Ordered in UC Medications - No data to display  Initial Impression / Assessment and Plan / UC Course  I have reviewed the triage vital signs and the nursing notes.  Pertinent labs & imaging results that were available during my care of the patient were reviewed by me and considered in my medical decision making (see chart for details).   Patient is a pleasant, though ill-appearing 57 year old Spanish-speaking female presenting for evaluation of COVID-like symptoms that started acutely when she woke up this morning.  She has been experiencing fatigue over the past week however.  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  Given her cluster of symptoms I will order a COVID PCR and strep PCR to evaluate for the presence of either infection.  Strep PCR is negative.  COVID PCR is negative.  I will discharge patient home with a diagnosis of viral URI with a cough with prescription retronasal spray to help with nasal congestion along with Tessalon Perles and Promethazine DM cough syrup to help with cough and congestion.  Rest, increase fluids, work note provided.  Return precautions reviewed.   Final Clinical Impressions(s) / UC Diagnoses   Final diagnoses:  Viral URI with cough     Discharge Instructions      La prueba que se hizo hoy no revel la presencia de estreptococo ni de COVID. Creo, en funcin de sus sntomas, que tiene una infeccin respiratoria viral.  Use Tylenol y/o ibuprofeno de venta libre segn las instrucciones del paquete  segn sea necesario para el dolor o la fiebre.  Descanse y beba muchos lquidos para mantenerse hidratado y ayudar a que su cuerpo se recupere.  Use el aerosol nasal Atrovent, 2 chorros en cada fosa nasal cada 6 horas, segn sea necesario para la secrecin nasal y Tax adviser.  Use Tessalon Perles cada 8  horas durante el da. Tmelos con un pequeo sorbo de agua. Es posible que le produzcan algo de entumecimiento en la base de la lengua o un sabor metlico en la boca, esto es normal.  Use el jarabe para la tos Promethazine DM antes de acostarse para la tos y Sales executive. Le producir somnolencia, as que no lo tome Administrator.  Regrese para una reevaluacin o consulte a su mdico de cabecera si tiene sntomas nuevos o que empeoran.  Your testing today did not reveal the presence of either strep or COVID.  I do believe, based upon your symptoms, that you have a viral respiratory infection.  Use over-the-counter Tylenol and/or ibuprofen according to the package instructions as needed for pain or fever.  Rest and drink plenty of fluids so that you maintain hydration and help your body heal.  Use the Atrovent nasal spray, 2 squirts in each nostril every 6 hours, as needed for runny nose and postnasal drip.  Use the Tessalon Perles every 8 hours during the day.  Take them with a small sip of water.  They may give you some numbness to the base of your tongue or a metallic taste in your mouth, this is normal.  Use the Promethazine DM cough syrup at bedtime for cough and congestion.  It will make you drowsy so do not take it during the day.  Return for reevaluation or see your primary care provider for any new or worsening symptoms.      ED Prescriptions     Medication Sig Dispense Auth. Provider   benzonatate (TESSALON) 100 MG capsule Take 2 capsules (200 mg total) by mouth every 8 (eight) hours. 21 capsule Becky Augusta, NP   ipratropium (ATROVENT) 0.06 % nasal spray Place 2 sprays into both nostrils 4 (four) times daily. 15 mL Becky Augusta, NP   promethazine-dextromethorphan (PROMETHAZINE-DM) 6.25-15 MG/5ML syrup Take 5 mLs by mouth 4 (four) times daily as needed. 118 mL Becky Augusta, NP      PDMP not reviewed this encounter.   Becky Augusta, NP 05/06/23 1245

## 2023-05-06 NOTE — ED Triage Notes (Addendum)
Brittney Hampton [id number]  Patient states that she woke up with body aches, cough, fever, sore throat, headache. Fatigued for a week.

## 2023-05-07 ENCOUNTER — Encounter: Payer: Medicaid Other | Admitting: Nurse Practitioner

## 2023-06-05 IMAGING — MR MR ABDOMEN WO/W CM
18 series · 48 of 48 positions shown · IV contrast (gadavist)
Comparison: None.

CLINICAL DATA: Further characterization of hepatic lesions seen on
prior outside CT.

EXAM:
MRI ABDOMEN WITHOUT AND WITH CONTRAST
TECHNIQUE: Multiplanar multisequence MR imaging of the abdomen was performed
both before and after the administration of intravenous contrast.
CONTRAST:  7.5mL GADAVIST GADOBUTROL 1 MMOL/ML IV SOLN

[Series 3: T2 · coronal · 6.0mm · 1.19mm/px · 2 of 30 slices shown (1 of 2)]
[im 1/30]
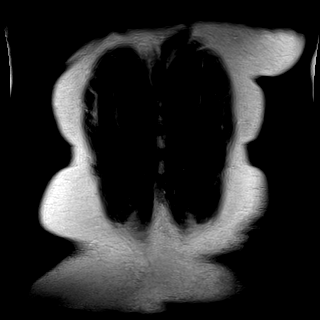
[im 30/30]
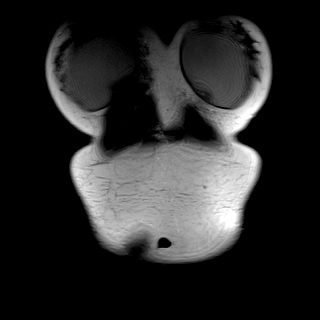

[Series 4: T2 · axial · 6.0mm · 1.19mm/px · z∈[-119,+104]mm · 2 of 32 slices shown (2 of 2)]
[im 1/32]
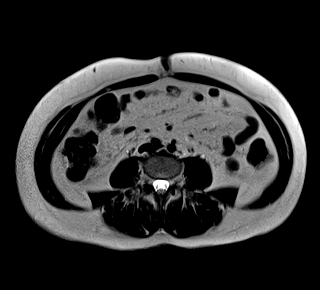
[im 32/32]
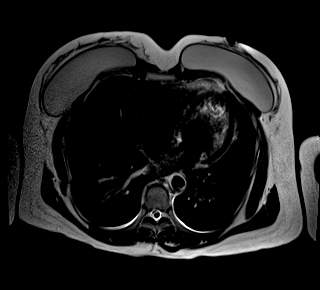

[Series 6: T2 fat-sat · axial · 6.0mm · 1.19mm/px · z∈[-125,+112]mm · 2 of 34 slices shown]
[im 1/34]
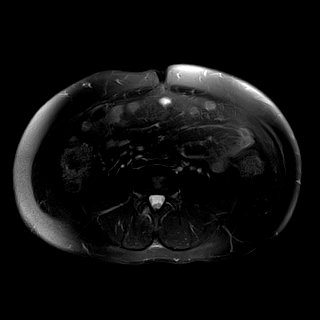
[im 34/34]
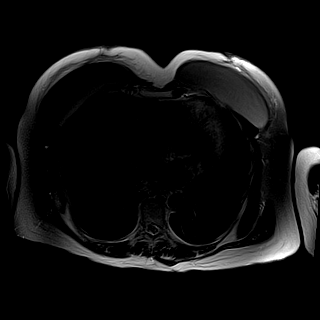

[Series 7: ax dwi_tracew · axial · 6.0mm · 1.42mm/px · z∈[-125,+112]mm · 4 of 102 slices shown]
[im 1/102]
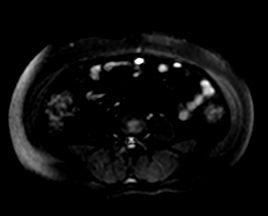
[im 34/102]
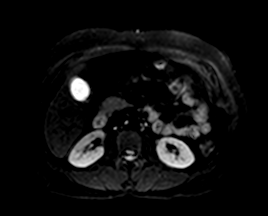
[im 68/102]
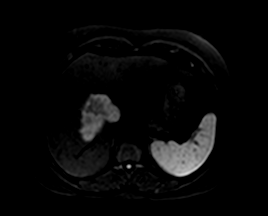
[im 102/102]
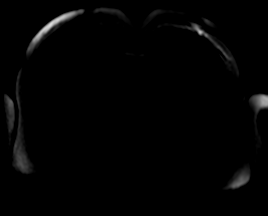

[Series 8: ax dwi_adc · axial · 6.0mm · 1.42mm/px · 1 of 34 slices shown]
[im 1/34]
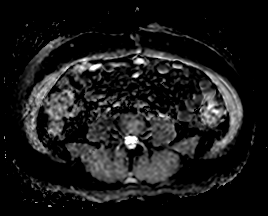

[Series 9: T1 · axial · 3.0mm · 1.19mm/px · z∈[-112,+101]mm · 3 of 72 slices shown (1 of 2)]
[im 1/72]
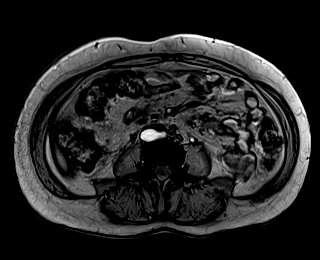
[im 36/72]
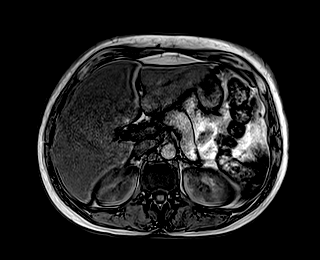
[im 72/72]
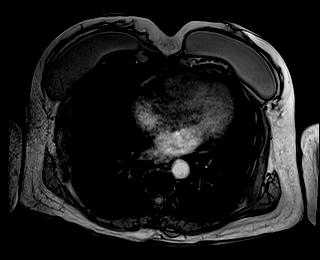

[Series 10: T1 · axial · 3.0mm · 1.19mm/px · z∈[-112,+101]mm · 3 of 72 slices shown (2 of 2)]
[im 1/72]
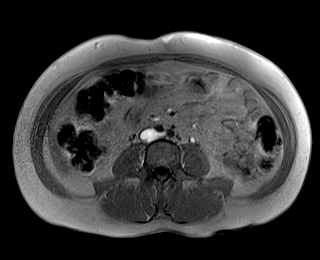
[im 36/72]
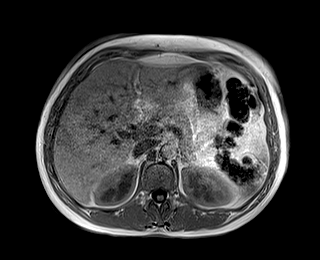
[im 72/72]
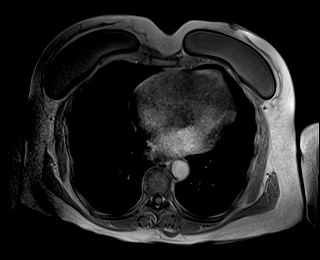

[Series 11: bSSFP · axial · 6.0mm · 0.74mm/px · 1 of 32 slices shown]
[im 1/32]
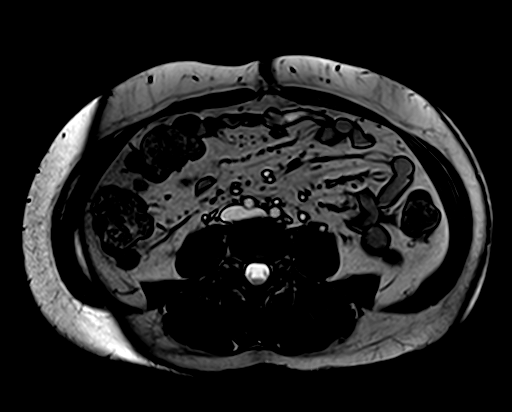

[Series 12: T1 dynamic fat-sat · axial · non-contrast · 3.0mm · 1.19mm/px · z∈[-124,+113]mm · 3 of 80 slices shown (1 of 5)]
[im 1/80]
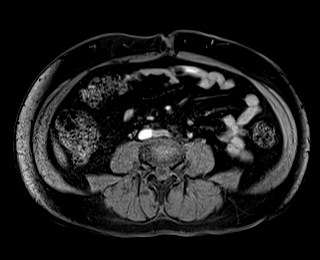
[im 40/80]
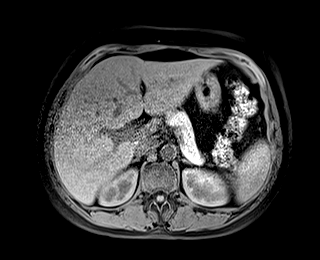
[im 80/80]
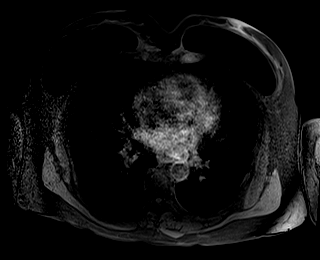

[Series 13: T1 dynamic fat-sat post-contrast · axial · 3.0mm · 1.19mm/px · z∈[-124,+113]mm · 3 of 80 slices shown (1 of 4)]
[im 1/80]
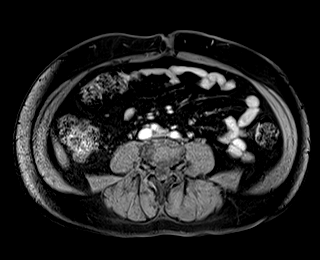
[im 40/80]
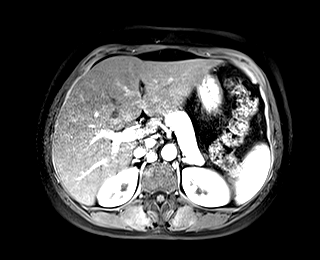
[im 80/80]
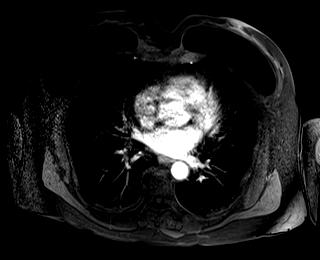

[Series 14: T1 dynamic fat-sat · axial · 3.0mm · 1.19mm/px · z∈[-124,+113]mm · 3 of 80 slices shown (2 of 5)]
[im 1/80]
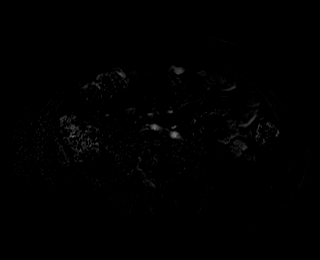
[im 40/80]
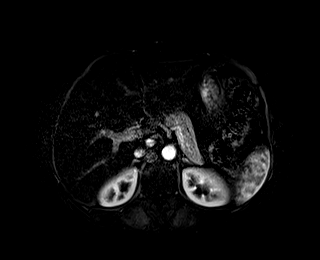
[im 80/80]
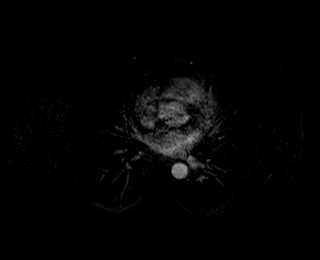

[Series 15: T1 dynamic fat-sat post-contrast · axial · 3.0mm · 1.19mm/px · z∈[-124,+113]mm · 3 of 80 slices shown (2 of 4)]
[im 1/80]
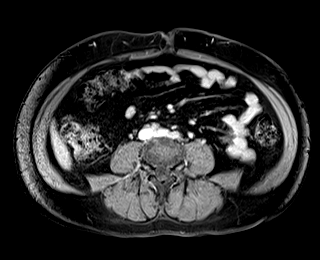
[im 40/80]
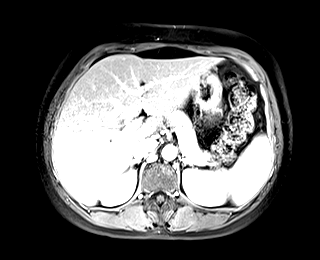
[im 80/80]
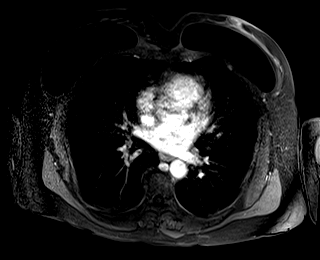

[Series 16: T1 dynamic fat-sat · axial · 3.0mm · 1.19mm/px · z∈[-124,+113]mm · 3 of 80 slices shown (3 of 5)]
[im 1/80]
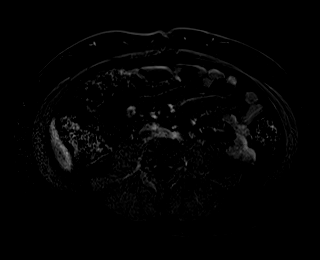
[im 40/80]
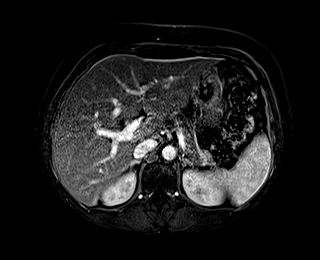
[im 80/80]
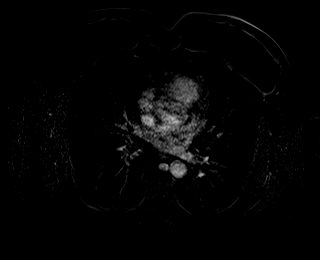

[Series 17: T1 dynamic fat-sat post-contrast · axial · 3.0mm · 1.19mm/px · z∈[-124,+113]mm · 3 of 80 slices shown (3 of 4)]
[im 1/80]
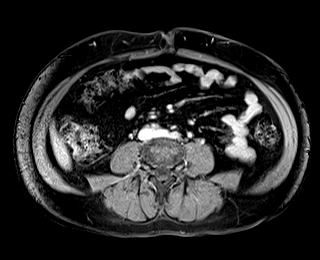
[im 40/80]
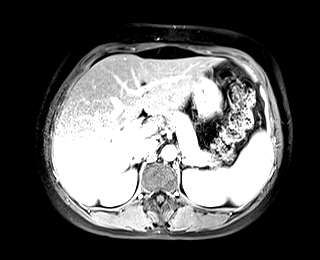
[im 80/80]
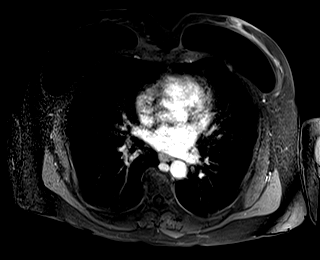

[Series 18: T1 dynamic fat-sat · axial · 3.0mm · 1.19mm/px · z∈[-124,+113]mm · 3 of 80 slices shown (4 of 5)]
[im 1/80]
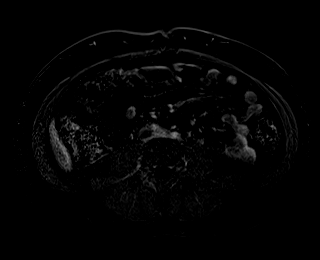
[im 40/80]
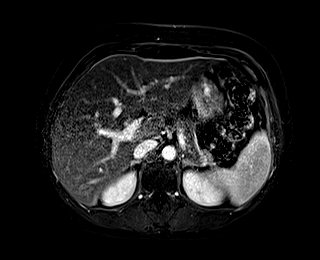
[im 80/80]
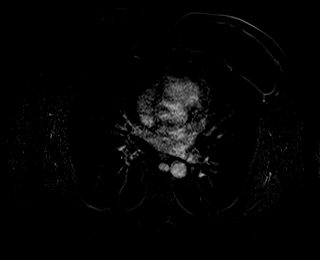

[Series 19: T1 dynamic post-contrast · coronal · 3.0mm · 1.31mm/px · 3 of 72 slices shown]
[im 1/72]
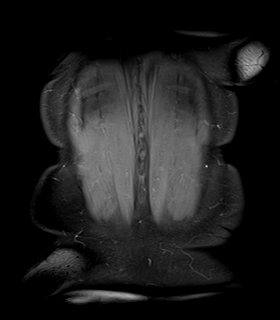
[im 36/72]
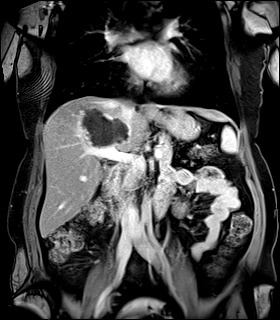
[im 72/72]
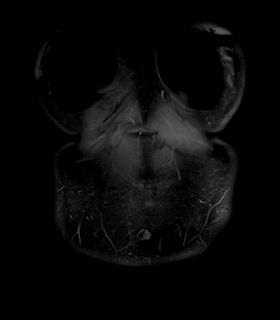

[Series 20: T1 dynamic fat-sat post-contrast · axial · 3.0mm · 1.19mm/px · z∈[-124,+113]mm · 3 of 80 slices shown (4 of 4)]
[im 1/80]
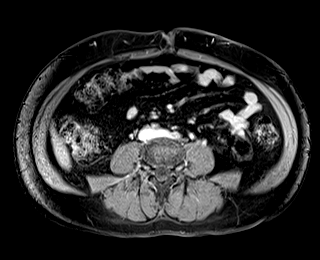
[im 40/80]
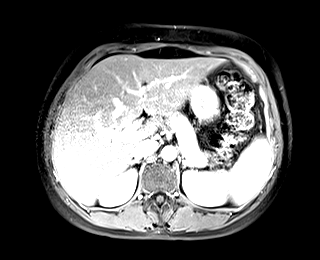
[im 80/80]
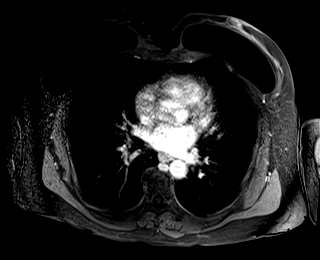

[Series 21: T1 dynamic fat-sat · axial · 3.0mm · 1.19mm/px · z∈[-124,+113]mm · 3 of 80 slices shown (5 of 5)]
[im 1/80]
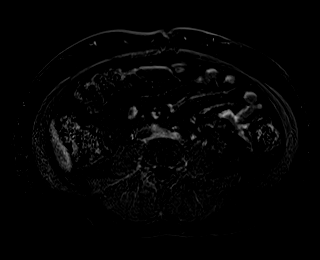
[im 40/80]
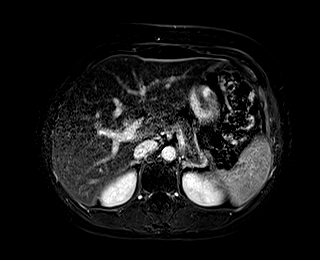
[im 80/80]
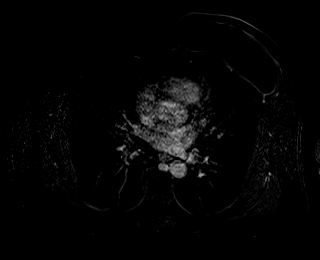

[48 of 48 positions shown; findings below may reference images not displayed]

FINDINGS: Lower chest: Bilateral breast prostheses.  No acute abnormality.

Hepatobiliary: Nodular T2 hyperintense T1 hypointense lesion in the
paramedian right lobe of the liver which measures 7.3 x 6.1 cm on
image [DATE], with a central area which is more T2 hyperintense and T1
hypointense than the periphery. The lesion demonstrates some timely
peripheral nodular foci of discontinuous enhancement on early
arterial phase imaging for instance on image 32/13 and 30/13 with
increased filling of these and other areas of peripheral nodular
discontinuous enhancement on delayed imaging with enhancement
intensity similar to aorta on all phases of postcontrast imaging. No
solid enhancing hepatic lesion.

Diffuse hepatic steatosis. Gallbladder is unremarkable. No biliary
ductal dilation.

Pancreas: No mass, inflammatory changes, or other parenchymal
abnormality identified.

Spleen:  Within normal limits in size and appearance.

Adrenals/Urinary Tract: No masses identified. No evidence of
hydronephrosis.

Stomach/Bowel: Visualized portions within the abdomen are
unremarkable.

Vascular/Lymphatic: No pathologically enlarged lymph nodes
identified. No abdominal aortic aneurysm demonstrated.

Other:  No abdominal ascites.

Musculoskeletal: No suspicious bone lesions identified.
IMPRESSION: 1. Nodular 7.3 cm lesion in the paramedian right lobe of the liver
which demonstrates imaging features most consistent with a giant
benign cavernous hepatic hemangioma.
2. Diffuse hepatic steatosis.

## 2023-08-16 ENCOUNTER — Encounter: Payer: Self-pay | Admitting: Nurse Practitioner

## 2023-08-16 ENCOUNTER — Ambulatory Visit (INDEPENDENT_AMBULATORY_CARE_PROVIDER_SITE_OTHER): Payer: Self-pay | Admitting: Nurse Practitioner

## 2023-08-16 VITALS — BP 110/63 | HR 58 | Temp 97.7°F | Wt 160.8 lb

## 2023-08-16 DIAGNOSIS — R200 Anesthesia of skin: Secondary | ICD-10-CM

## 2023-08-16 DIAGNOSIS — I700 Atherosclerosis of aorta: Secondary | ICD-10-CM | POA: Diagnosis not present

## 2023-08-16 DIAGNOSIS — R768 Other specified abnormal immunological findings in serum: Secondary | ICD-10-CM

## 2023-08-16 DIAGNOSIS — R253 Fasciculation: Secondary | ICD-10-CM

## 2023-08-16 DIAGNOSIS — L658 Other specified nonscarring hair loss: Secondary | ICD-10-CM | POA: Diagnosis not present

## 2023-08-16 DIAGNOSIS — E538 Deficiency of other specified B group vitamins: Secondary | ICD-10-CM

## 2023-08-16 DIAGNOSIS — H539 Unspecified visual disturbance: Secondary | ICD-10-CM

## 2023-08-16 DIAGNOSIS — R202 Paresthesia of skin: Secondary | ICD-10-CM

## 2023-08-16 DIAGNOSIS — M2559 Pain in other specified joint: Secondary | ICD-10-CM

## 2023-08-16 DIAGNOSIS — R7301 Impaired fasting glucose: Secondary | ICD-10-CM | POA: Diagnosis not present

## 2023-08-16 MED ORDER — GABAPENTIN 100 MG PO CAPS: 100.0000 mg | ORAL_CAPSULE | Freq: Every day | ORAL | 4 refills | Status: DC

## 2023-08-16 MED ORDER — PANTOPRAZOLE SODIUM 40 MG PO TBEC: 40.0000 mg | DELAYED_RELEASE_TABLET | Freq: Every day | ORAL | 4 refills | Status: DC

## 2023-08-16 MED ORDER — MINOXIDIL 2 % EX SOLN: Freq: Two times a day (BID) | CUTANEOUS | 0 refills | Status: DC

## 2023-08-16 NOTE — Assessment & Plan Note (Signed)
Ongoing, continue collaboration with rheumatology.  Recent note reviewed.

## 2023-08-16 NOTE — Progress Notes (Addendum)
BP 110/63   Pulse (!) 58   Temp 97.7 F (36.5 C) (Oral)   Wt 160 lb 12.8 oz (72.9 kg)   LMP 12/06/2016 (Approximate)   SpO2 97%   BMI 26.76 kg/m    Subjective:    Patient ID: Brittney Hampton, female    DOB: [birth year], 57 y.o.   MRN: [id number]  HPI: Brittney Hampton is a 57 y.o. female  Chief Complaint  Patient presents with   Numbness    Patient states she has been noticing numbness in her hands and feet for a while. States she was told in the past she was told to take vitamin b12, which she states she did, but states she is still experiencing the numbness   Alopecia    Patient states she has been experiencing hair loss for the last 6 months    Eye Problem    Patient states she has been noticing twitching in her eye lids, more so on the L eyelid than the R   Interpreter at bedside to assist.  NUMBNESS Reports ongoing numbness to hands and feet, previously noted to have low B12 level and is taking supplement but reports no benefit -- recent levels have been stable.  History of Positive ANA and polyarthralgia  which is followed by rheumatology, has not seen since 08/30/22.  Has pain to shoulders and waist, they ordered Celebrex in past but this did not help much.  Often numbness lasts for 2-3 minutes and goes away.  She reports having hair loss, she reported similar symptoms in past with reassuring labs. Duration: months Onset: gradual Location: both hands and feet Bilateral: yes Symmetric: yes Decreased sensation: no -- does feel sensation Weakness: yes to feet but not to hands Pain: yes to waist (lower back) and shoulders Quality:  dull, aching, and throbbing Severity: 8/10  Frequency: intermittent Trauma: no Recent illness: no Diabetes: no Thyroid disease: no  HIV: no  Alcoholism: no  Spinal cord injury: no Alleviating factors: nothing Aggravating factors: unknown Status: uncontrolled Treatments attempted:  Celebrex  EYE TWITCHING  This started 3  months ago -- came on suddenly.  Drinks coffee but not regularly. No recent eye exam -- last was a year ago, provided eye reading glasses only -- she reports vision was the same then.  Underlying aortic atherosclerosis on past imaging. Duration:  months Involved eye:  bilateral L>R Foreign body sensation:no Visual impairment: has been noticing changes in vision -- sometimes vision gets very blurry and sees double but after some time it comes back Eye redness: no Discharge: no Crusting or matting of eyelids: no Swelling: no Photophobia: no Itching: no Tearing: no Headache: no Floaters: no URI symptoms: no Aggravating factors: unknown Alleviating factors: unknown Status: stable Treatments attempted: none  Flowsheet Row Office Visit from 08/16/2023 in [facility]  PHQ-2 Total Score 0          08/16/2023    4:58 PM 07/05/2022    4:08 PM 05/10/2022   10:02 AM 01/25/2022   11:07 AM  GAD 7 : Generalized Anxiety Score  Nervous, Anxious, on Edge 0 0 0 0  Control/stop worrying 0 0 0 0  Worry too much - different things 0 0 0 0  Trouble relaxing 0 0 0 0  Restless 0 0 0 0  Easily annoyed or irritable 0 0 0 0  Afraid - awful might happen 0 0 0 0  Total GAD 7 Score 0 0 0 0  Anxiety Difficulty Not difficult at all Not difficult at all Not difficult at all       Relevant past medical, surgical, family and social history reviewed and updated as indicated. Interim medical history since our last visit reviewed. Allergies and medications reviewed and updated.  Review of Systems  Constitutional:  Positive for fatigue. Negative for activity change, appetite change, diaphoresis and fever.  Eyes:  Positive for visual disturbance. Negative for photophobia, pain, discharge and itching.  Respiratory:  Negative for cough, chest tightness, shortness of breath and wheezing.   Cardiovascular:  Negative for chest pain, palpitations and leg swelling.  Gastrointestinal:  Negative.   Endocrine: Negative for cold intolerance, heat intolerance, polydipsia, polyphagia and polyuria.  Musculoskeletal:  Positive for arthralgias.  Neurological:  Positive for weakness and numbness. Negative for dizziness, syncope, light-headedness and headaches.  Psychiatric/Behavioral: Negative.      Per HPI unless specifically indicated above     Objective:    BP 110/63   Pulse (!) 58   Temp 97.7 F (36.5 C) (Oral)   Wt 160 lb 12.8 oz (72.9 kg)   LMP 12/06/2016 (Approximate)   SpO2 97%   BMI 26.76 kg/m   Wt Readings from Last 3 Encounters:  08/16/23 160 lb 12.8 oz (72.9 kg)  05/06/23 162 lb (73.5 kg)  11/10/22 174 lb 12.8 oz (79.3 kg)    Physical Exam Vitals and nursing note reviewed.  Constitutional:      General: She is awake. She is not in acute distress.    Appearance: Normal appearance. She is well-developed and well-groomed. She is not ill-appearing or toxic-appearing.  HENT:     Head: Normocephalic. Hair is abnormal (thinning).     Right Ear: Hearing, tympanic membrane, ear canal and external ear normal.     Left Ear: Hearing, tympanic membrane, ear canal and external ear normal.     Nose: Nose normal.     Right Sinus: No maxillary sinus tenderness or frontal sinus tenderness.     Left Sinus: No maxillary sinus tenderness or frontal sinus tenderness.     Mouth/Throat:     Mouth: Mucous membranes are moist.     Pharynx: No pharyngeal swelling, oropharyngeal exudate or posterior oropharyngeal erythema.  Eyes:     General: Lids are normal.        Right eye: No discharge.        Left eye: No discharge.     Extraocular Movements: Extraocular movements intact.     Conjunctiva/sclera: Conjunctivae normal.     Pupils: Pupils are equal, round, and reactive to light.     Visual Fields: Right eye visual fields normal and left eye visual fields normal.  Neck:     Thyroid: No thyromegaly.     Vascular: No carotid bruit.  Cardiovascular:     Rate and Rhythm:  Normal rate and regular rhythm.     Heart sounds: Normal heart sounds. No murmur heard.    No gallop.  Pulmonary:     Effort: Pulmonary effort is normal. No accessory muscle usage or respiratory distress.     Breath sounds: Normal breath sounds. No decreased breath sounds, wheezing or rhonchi.  Abdominal:     General: Bowel sounds are normal. There is no distension.     Palpations: Abdomen is soft.     Tenderness: There is no abdominal tenderness.  Musculoskeletal:     Cervical back: Normal range of motion and neck supple.     Right lower leg: No edema.  Left lower leg: No edema.  Lymphadenopathy:     Cervical: No cervical adenopathy.  Skin:    General: Skin is warm and dry.  Neurological:     Mental Status: She is alert and oriented to person, place, and time.     Cranial Nerves: Cranial nerves 2-12 are intact.     Sensory: Sensation is intact.     Motor: Motor function is intact.     Coordination: Coordination is intact.     Gait: Gait is intact.     Deep Tendon Reflexes: Reflexes are normal and symmetric.     Reflex Scores:      Brachioradialis reflexes are 2+ on the right side and 2+ on the left side.      Patellar reflexes are 2+ on the right side and 2+ on the left side. Psychiatric:        Attention and Perception: Attention normal.        Mood and Affect: Mood normal.        Speech: Speech normal.        Behavior: Behavior normal. Behavior is cooperative.        Thought Content: Thought content normal.    Results for orders placed or performed during the hospital encounter of 05/06/23  SARS Coronavirus 2 by RT PCR (hospital order, performed in [facility] hospital lab) *cepheid single result test* Anterior Nasal Swab   Specimen: Anterior Nasal Swab  Result Value Ref Range   SARS Coronavirus 2 by RT PCR NEGATIVE NEGATIVE  Group A Strep by PCR   Specimen: Throat; Sterile Swab  Result Value Ref Range   Group A Strep by PCR NOT DETECTED NOT DETECTED       Assessment & Plan:   Problem List Items Addressed This Visit       Cardiovascular and Mediastinum   Aortic atherosclerosis (HCC) - Primary (Chronic)    Ongoing.  Plan on repeat lipid panel in future once fasting.  Monitor and if increased risk will recommend ASA and statin.        Endocrine   IFG (impaired fasting glucose)    Stable, noted on past labs.  Will check A1c today.      Relevant Orders   HgB A1c     Musculoskeletal and Integument   Female pattern hair loss    Ongoing, last labs reassuring, will recheck today.  Start Minoxidil lotion as there is thinning of hair noted.  Educated on this.        Other   B12 deficiency (Chronic)    Chronic, ongoing. Currently taking oral supplement.  Did have injections at one time in office, but has missed these.  Check level today.        Relevant Orders   Vitamin B12   Ambulatory referral to Neurology   ANA positive    Ongoing, continue collaboration with rheumatology.  Recent note reviewed.      Eye muscle twitches    L>R for three months with vision changes + other ongoing neurologic symptoms.  Would benefit imaging of head to further assess.  Will order this, discussed with patient.  Recommend she return to eye doctor for another eye exam.  Discussed with her to avoid caffeine.      Relevant Orders   Ambulatory referral to Neurology   CT HEAD W & WO CONTRAST ( )   Joint pain    Ongoing -- recommend she follow-up with rheumatology. Shoulder and lower back today.  Will trial  Gabapentin and obtain labs.  Educated on this plan of care.      Relevant Orders   CBC with Differential/Platelet   Comprehensive metabolic panel   TSH   Uric acid   Numbness and tingling    Ongoing for over 6 months to hands and feet only.  Has had past imaging of lumbar spine with mild OA noted.  Overall neuro exam reassuring.  Recently has started having vision changes and eye twitching as well.  Will order CT head to further assess for any  acute findings.  Referral to neurology placed.  Gabapentin 100 MG at night ordered and to continue B12.  Labs today.  Educated her on this plan of care.      Relevant Orders   CBC with Differential/Platelet   Comprehensive metabolic panel   Ferritin   Folate   Iron   Magnesium   TSH   Ambulatory referral to Neurology   CT HEAD W & WO CONTRAST ( )   Vision changes    With new onset eye muscle twitching over past three months.  Diplopia and blurred vision.  Has other neurological symptoms present.  Will perform CT head to assess for any acute issues.  Labs today.  Recommend she follow-up with eye doctor ASAP.      Relevant Orders   CT HEAD W & WO CONTRAST ( )     Follow up plan: Return in about 6 weeks (around 09/27/2023).

## 2023-08-16 NOTE — Assessment & Plan Note (Signed)
Chronic, ongoing. Currently taking oral supplement.  Did have injections at one time in office, but has missed these.  Check level today.

## 2023-08-16 NOTE — Assessment & Plan Note (Signed)
Ongoing, last labs reassuring, will recheck today.  Start Minoxidil lotion as there is thinning of hair noted.  Educated on this.

## 2023-08-16 NOTE — Assessment & Plan Note (Signed)
With new onset eye muscle twitching over past three months.  Diplopia and blurred vision.  Has other neurological symptoms present.  Will perform CT head to assess for any acute issues.  Labs today.  Recommend she follow-up with eye doctor ASAP.

## 2023-08-16 NOTE — Patient Instructions (Signed)
 Peripheral Neuropathy Peripheral neuropathy is a type of nerve damage. It affects nerves that carry signals between the spinal cord and the arms, legs, and the rest of the body (peripheral nerves). It does not affect nerves in the spinal cord or brain. In peripheral neuropathy, one nerve or a group of nerves may be damaged. Peripheral neuropathy is a broad category that includes many specific nerve disorders, like diabetic neuropathy, hereditary neuropathy, and carpal tunnel syndrome. What are the causes? This condition may be caused by: Certain diseases, such as: Diabetes. This is the most common cause of peripheral neuropathy. Autoimmune diseases, such as rheumatoid arthritis and systemic lupus erythematosus. Nerve diseases that are passed from parent to child (inherited). Kidney disease. Thyroid disease. Other causes may include: Nerve injury. Pressure or stress on a nerve that lasts a long time. Lack (deficiency) of B vitamins. This can result from alcoholism, poor diet, or a restricted diet. Infections. Some medicines, such as cancer medicines (chemotherapy). Poisonous (toxic) substances, such as lead and mercury. Too little blood flowing to the legs. In some cases, the cause of this condition is not known. What are the signs or symptoms? Symptoms of this condition depend on which of your nerves is damaged. Symptoms in the legs, hands, and arms can include: Loss of feeling (numbness) in the feet, hands, or both. Tingling in the feet, hands, or both. Burning pain. Very sensitive skin. Weakness. Not being able to move a part of the body (paralysis). Clumsiness or poor coordination. Muscle twitching. Loss of balance. Symptoms in other parts of the body can include: Not being able to control your bladder. Feeling dizzy. Sexual problems. How is this diagnosed? Diagnosing and finding the cause of peripheral neuropathy can be difficult. Your health care provider will take your  medical history and do a physical exam. A neurological exam will also be done. This involves checking things that are affected by your brain, spinal cord, and nerves (nervous system). For example, your health care provider will check your reflexes, how you move, and what you can feel. You may have other tests, such as: Blood tests. Electromyogram (EMG) and nerve conduction tests. These tests check nerve function and how well the nerves are controlling the muscles. Imaging tests, such as a CT scan or MRI, to rule out other causes of your symptoms. Removing a small piece of nerve to be examined in a lab (nerve biopsy). Removing and examining a small amount of the fluid that surrounds the brain and spinal cord (lumbar puncture). How is this treated? Treatment for this condition may involve: Treating the underlying cause of the neuropathy, such as diabetes, kidney disease, or vitamin deficiencies. Stopping medicines that can cause neuropathy, such as chemotherapy. Medicine to help relieve pain. Medicines may include: Prescription or over-the-counter pain medicine. Anti-seizure medicine. Antidepressants. Pain-relieving patches that are applied to painful areas of skin. Surgery to relieve pressure on a nerve or to destroy a nerve that is causing pain. Physical therapy to help improve movement and balance. Devices to help you move around (assistive devices). Follow these instructions at home: Medicines Take over-the-counter and prescription medicines only as told by your health care provider. Do not take any other medicines without first asking your health care provider. Ask your health care provider if the medicine prescribed to you requires you to avoid driving or using machinery. Lifestyle  Do not use any products that contain nicotine or tobacco. These products include cigarettes, chewing tobacco, and vaping devices, such as e-cigarettes. Smoking keeps  blood from reaching damaged nerves. If you  need help quitting, ask your health care provider. Avoid or limit alcohol. Too much alcohol can cause a vitamin B deficiency, and vitamin B is needed for healthy nerves. Eat a healthy diet. This includes: Eating foods that are high in fiber, such as beans, whole grains, and fresh fruits and vegetables. Limiting foods that are high in fat and processed sugars, such as fried or sweet foods. General instructions  If you have diabetes, work closely with your health care provider to keep your blood sugar under control. If you have numbness in your feet: Check every day for signs of injury or infection. Watch for redness, warmth, and swelling. Wear padded socks and comfortable shoes. These help protect your feet. Develop a good support system. Living with peripheral neuropathy can be stressful. Consider talking with a mental health specialist or joining a support group. Use assistive devices and attend physical therapy as told by your health care provider. This may include using a walker or a cane. Keep all follow-up visits. This is important. Where to find more information [employer] of Neurological Disorders: [employer] Contact a health care provider if: You have new signs or symptoms of peripheral neuropathy. You are struggling emotionally from dealing with peripheral neuropathy. Your pain is not well controlled. Get help right away if: You have an injury or infection that is not healing normally. You develop new weakness in an arm or leg. You have fallen or do so frequently. Summary Peripheral neuropathy is when the nerves in the arms or legs are damaged, resulting in numbness, weakness, or pain. There are many causes of peripheral neuropathy, including diabetes, pinched nerves, vitamin deficiencies, autoimmune disease, and hereditary conditions. Diagnosing and finding the cause of peripheral neuropathy can be difficult. Your health care provider will take your medical  history, do a physical exam, and do tests, including blood tests and nerve function tests. Treatment involves treating the underlying cause of the neuropathy and taking medicines to help control pain. Physical therapy and assistive devices may also help. This information is not intended to replace advice given to you by your health care provider. Make sure you discuss any questions you have with your health care provider. Document Revised: 05/03/2021 Document Reviewed: 05/03/2021 Elsevier Patient Education  2024 [employer].

## 2023-08-16 NOTE — Assessment & Plan Note (Signed)
L>R for three months with vision changes + other ongoing neurologic symptoms.  Would benefit imaging of head to further assess.  Will order this, discussed with patient.  Recommend she return to eye doctor for another eye exam.  Discussed with her to avoid caffeine.

## 2023-08-16 NOTE — Assessment & Plan Note (Signed)
Stable, noted on past labs.  Will check A1c today.

## 2023-08-16 NOTE — Assessment & Plan Note (Signed)
Ongoing for over 6 months to hands and feet only.  Has had past imaging of lumbar spine with mild OA noted.  Overall neuro exam reassuring.  Recently has started having vision changes and eye twitching as well.  Will order CT head to further assess for any acute findings.  Referral to neurology placed.  Gabapentin 100 MG at night ordered and to continue B12.  Labs today.  Educated her on this plan of care.

## 2023-08-16 NOTE — Assessment & Plan Note (Addendum)
Ongoing.  Plan on repeat lipid panel in future once fasting.  Monitor and if increased risk will recommend ASA and statin.

## 2023-08-16 NOTE — Assessment & Plan Note (Signed)
Ongoing -- recommend she follow-up with rheumatology. Shoulder and lower back today.  Will trial Gabapentin and obtain labs.  Educated on this plan of care.

## 2023-08-31 ENCOUNTER — Ambulatory Visit (INDEPENDENT_AMBULATORY_CARE_PROVIDER_SITE_OTHER): Payer: Self-pay | Admitting: Physician Assistant

## 2023-08-31 ENCOUNTER — Encounter: Payer: Self-pay | Admitting: Physician Assistant

## 2023-08-31 VITALS — BP 118/82 | HR 75 | Temp 98.3°F | Ht 65.0 in | Wt 161.0 lb

## 2023-08-31 DIAGNOSIS — G8929 Other chronic pain: Secondary | ICD-10-CM | POA: Insufficient documentation

## 2023-08-31 DIAGNOSIS — R202 Paresthesia of skin: Secondary | ICD-10-CM

## 2023-08-31 DIAGNOSIS — M549 Dorsalgia, unspecified: Secondary | ICD-10-CM

## 2023-08-31 DIAGNOSIS — K219 Gastro-esophageal reflux disease without esophagitis: Secondary | ICD-10-CM

## 2023-08-31 DIAGNOSIS — G44209 Tension-type headache, unspecified, not intractable: Secondary | ICD-10-CM

## 2023-08-31 DIAGNOSIS — L659 Nonscarring hair loss, unspecified: Secondary | ICD-10-CM

## 2023-08-31 MED ORDER — TIZANIDINE HCL 2 MG PO TABS: 2.0000 mg | ORAL_TABLET | Freq: Every evening | ORAL | 0 refills | Status: DC

## 2023-08-31 MED ORDER — DICLOFENAC SODIUM 1 % EX GEL: 2.0000 g | Freq: Four times a day (QID) | CUTANEOUS | 1 refills | Status: DC

## 2023-08-31 MED ORDER — MINOXIDIL 2 % EX SOLN: Freq: Two times a day (BID) | CUTANEOUS | 1 refills | Status: AC

## 2023-08-31 NOTE — Progress Notes (Signed)
Date:  08/31/2023   Name:  Brittney Hampton   DOB:  1965/11/26   MRN:  [id number]   Chief Complaint: Establish Care, Headache, Alopecia (X7-8 month, Hair loss, has not seen a dermatologist, getting worse, when brushing hair), and Abdominal Pain (Doctor told her its gastritis from UC, has not seen GI)  Headache  This is a chronic problem. The current episode started more than 1 year ago. Episode frequency: comes and goes. The problem has been unchanged. The pain is located in the Frontal, occipital and temporal region. The pain does not radiate. The pain quality is similar to prior headaches. The quality of the pain is described as sharp. The pain is at a severity of 6/10. Associated symptoms include abdominal pain and nausea. Associated symptoms comments: Vision loss . Nothing aggravates the symptoms. She has tried acetaminophen for the symptoms. The treatment provided mild relief.  Abdominal Pain This is a new problem. The current episode started more than 1 year ago. Episode frequency: comes and goes. The problem has been unchanged. The pain is located in the periumbilical region. The pain is at a severity of 7/10. The pain is moderate. The quality of the pain is burning. The abdominal pain radiates to the back. Associated symptoms include headaches and nausea. Nothing aggravates the pain. The pain is relieved by Movement. She has tried acetaminophen for the symptoms. The treatment provided mild relief. Her past medical history is significant for GERD.   Brittney Hampton is a pleasant 57 year old female with history of osteopenia, GERD, hepatic steatosis, venous insufficiency, multiple vitamin deficiencies, mild HLD, and ANA positivity new to the clinic today to establish care.  She is joined by her son Brittney Hampton today who assists with some translation, though she is fluent in [place] and communicates easily with adequate understanding. Has follow-up with rheumatology Dr. Allena Katz 10/11/2023.  Recent lapse in  health insurance, states it is offered by her employer but is too expensive for the coverage it provides. She tells me even when she had the insurance, she was still responsible for many healthcare associated costs.   Complains of persistent epigastric stomach pain every morning, currently taking pantoprazole daily which helps.  Prior diagnosis of GERD.  Does not habitually use NSAIDs.  No dark/bloody stool.  Has never seen GI nor had endoscopy performed.  Mentions diffuse hair loss, prescribed minoxidil topical by prior PCP, but never picked up from pharmacy due to presumed electronic prescribing error.  Mentions frequent headaches in addition to upper and lower back pain.  Works long hours 8a-8p, 5 days/week. She reports a lot of work-associated stress.   Complains of intermittent paresthesia described as tingling of upper and lower extremities, especially the fingers.  History of known vitamin deficiencies, stopped multivitamin a couple months ago after labs were normal.  Medication list has been reviewed and updated.  Current Meds  Medication Sig   diclofenac Sodium (VOLTAREN) 1 % GEL Apply 2 g topically 4 (four) times daily. Use on affected joint up to 4x/day as needed. 2 grams is roughly 4 fingertips' worth of gel.   gabapentin (NEURONTIN) 100 MG capsule Take 1 capsule (100 mg total) by mouth at bedtime.   pantoprazole (PROTONIX) 40 MG tablet Take 1 tablet (40 mg total) by mouth daily.   tiZANidine (ZANAFLEX) 2 MG tablet Take 1 tablet (2 mg total) by mouth every evening. Use for muscle tension   [DISCONTINUED] minoxidil (ROGAINE) 2 % external solution Apply topically 2 (two) times daily. To scalp  for hair loss.     Review of Systems  Gastrointestinal:  Positive for abdominal pain and nausea.  Neurological:  Positive for headaches.    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   Chronic pain of both knees 06/07/2022   Lumbar spondylolysis 02/25/2022   Osteopenia determined by x-ray 01/27/2022   Nodule of lower lobe of right lung 01/26/2022   Vitamin D deficiency 10/25/2021   Varicose veins of both lower extremities with pain 10/24/2021   GERD (gastroesophageal reflux disease) 10/24/2021   History of kidney stones 08/16/2021   Mild hyperlipidemia 08/16/2021   Insomnia 06/20/2021   Hepatic hemangioma 04/30/2021   Hepatic steatosis 04/30/2021   ANA positive 04/04/2021   B12 deficiency 04/02/2021   Joint pain 04/01/2021   IFG (impaired fasting glucose) 04/01/2021   Financial difficulties 04/01/2021   Aortic atherosclerosis (HCC) 03/31/2021    No Known Allergies  Immunization History  Administered Date(s) Administered   Influenza,inj,Quad PF,6+ Mos 06/20/2021   Influenza-Unspecified 05/27/2022   PFIZER(Purple Top)SARS-COV-2 Vaccination 04/08/2020, 05/10/2020, 12/28/2020   Td 05/27/2022   Tdap 05/27/2022   Zoster Recombinant(Shingrix) 05/27/2022    Past Surgical History:  Procedure Laterality Date   ABDOMINAL SURGERY     CESAREAN SECTION      Social History   Tobacco Use   Smoking status: Never   Smokeless tobacco: Never  Vaping Use   Vaping status: Never Used  Substance Use Topics   Alcohol use: Never   Drug use: Never    Family History  Problem Relation Age of Onset   Osteoarthritis Mother    Diabetes Father    Cancer Sister    Cancer Brother         08/31/2023   10:48 AM 08/16/2023    4:58 PM 07/05/2022    4:08 PM 05/10/2022   10:02 AM  GAD 7 : Generalized Anxiety Score  Nervous, Anxious, on Edge 0 0 0 0  Control/stop worrying 0 0 0 0  Worry too much - different things 0 0 0 0  Trouble relaxing 0 0 0 0  Restless 0 0 0 0  Easily annoyed or irritable 0 0 0 0  Afraid - awful might happen 0 0 0 0  Total GAD 7 Score 0 0 0 0  Anxiety Difficulty Not  difficult at all Not difficult at all Not difficult at all Not difficult at all       08/31/2023   10:47 AM 08/16/2023    4:58 PM 07/05/2022    4:07 PM  Depression screen PHQ 2/9  Decreased Interest 0 0 0  Down, Depressed, Hopeless 0 0 0  PHQ - 2 Score 0 0 0  Altered sleeping 0 0 0  Tired, decreased energy 0 0 0  Change in appetite 0 0 0  Feeling bad or failure about yourself  0 0 0  Trouble concentrating 0 0 0  Moving slowly or fidgety/restless 0 0 0  Suicidal thoughts 0 0 0  PHQ-9 Score 0 0 0  Difficult doing work/chores Not difficult at all Not difficult at all Not difficult at all    BP Readings from Last 3 Encounters:  08/31/23 118/82  08/16/23 110/63  05/06/23 135/79    Wt Readings from Last 3 Encounters:  08/31/23 161 lb (73 kg)  08/16/23 160 lb 12.8 oz (72.9 kg)  05/06/23 162 lb (73.5 kg)    BP 118/82 (BP Location: Left Arm, Patient Position: Sitting, Cuff Size: Normal)   Pulse 75   Temp 98.3 F (36.8 C) (Oral)   Ht 5\' 5"  (1.651 m)   Wt 161 lb (73 kg)   LMP 12/06/2016 (Approximate)   SpO2 96%   BMI 26.79 kg/m   Physical Exam Vitals and nursing note reviewed.  Constitutional:      Appearance: Normal appearance.  Cardiovascular:     Rate and Rhythm: Normal rate and regular rhythm.     Heart sounds: No murmur heard.    No friction rub. No gallop.  Pulmonary:     Effort: Pulmonary effort is normal.     Breath sounds: Normal breath sounds.  Abdominal:     General: There is no distension.  Musculoskeletal:        General: Normal range of motion.     Comments: Significant muscle tension and tenderness appreciated bilateral trapezius muscles.  Midline spinal tenderness at upper thoracic and lumbar spine.  Skin:    General: Skin is warm and dry.     Comments: Sparse hair noted diffusely across the scalp in no particular pattern.  Neurological:     Mental Status: She is alert and oriented to person, place, and time.     Gait: Gait is intact.   Psychiatric:        Mood and Affect: Mood and affect normal.     Recent Labs     Component Value Date/Time   NA 145 (H) 10/10/2022 1509   K 4.0 10/10/2022 1509   CL 106 10/10/2022 1509   CO2 24 10/10/2022 1509   GLUCOSE 130 (H) 10/10/2022 1509   GLUCOSE 127 (H) 12/03/2020 1312   BUN 10 10/10/2022 1509   CREATININE 0.70 10/10/2022 1509   CALCIUM 10.0 10/10/2022 1509   PROT 6.9 10/10/2022 1509   ALBUMIN 4.2 10/10/2022 1509   AST 17 10/10/2022 1509   ALT 16 10/10/2022 1509   ALKPHOS 81 10/10/2022 1509   BILITOT 0.6 10/10/2022 1509   GFRNONAA >60 12/03/2020 1312    Lab Results  Component Value Date   WBC 4.5 10/10/2022   HGB 14.3 10/10/2022   HCT 43.1 10/10/2022   MCV 90 10/10/2022   PLT 293 10/10/2022   Lab Results  Component Value Date   HGBA1C 5.5 01/25/2022   Lab Results  Component Value Date   CHOL 221 (H) 07/13/2021   HDL 66 07/13/2021   LDLCALC 137 (H) 07/13/2021   TRIG 103 07/13/2021   CHOLHDL 3.3 07/13/2021   Lab Results  Component Value Date   TSH 1.840 10/10/2022     Assessment and Plan:  1. Chronic midline back pain, unspecified back location (Primary) Will try to avoid oral NSAIDs due to current GERD which is suboptimally controlled. Trial topical diclofenac applied to the affected areas of the back or other joints.  Also prescribing tizanidine for muscle tension which may be contributing to some of her back pain, but advised muscle relaxers may cause drowsiness, so she should consider taking only in the evening especially on workdays. - diclofenac Sodium (VOLTAREN) 1 % GEL; Apply 2 g topically 4 (four) times daily. Use on affected joint up to 4x/day as needed. 2 grams is roughly 4 fingertips' worth of gel.  Dispense: 100 g; Refill: 1 - tiZANidine (ZANAFLEX) 2 MG tablet; Take 1 tablet (2 mg total) by mouth every evening.  Use for muscle tension  Dispense: 30 tablet; Refill: 0  2. Paresthesia of upper and lower extremities of both sides Strongly  encouraged to continue multivitamin in the context of history of vitamin B and D deficiencies as well as osteopenia.  Consider also nerve entrapment from muscle tension/inflammation  3. Hair loss Could be stress-induced.  Refilling topical minoxidil as below.  Patient to call the office if this medication is not affordable. - minoxidil (ROGAINE) 2 % external solution; Apply topically 2 (two) times daily. To scalp for hair loss.  Dispense: 60 mL; Refill: 1  4. Muscle tension headache Plan as above, very likely her headaches are related to work stressors manifesting as significant neck and back tension. - tiZANidine (ZANAFLEX) 2 MG tablet; Take 1 tablet (2 mg total) by mouth every evening. Use for muscle tension  Dispense: 30 tablet; Refill: 0  5. Gastroesophageal reflux disease, unspecified whether esophagitis present Continue pantoprazole at the current dose for now.  Consider GI referral in the future, but will wait for her to get insurance first.  She is in agreement with this plan.    F/u PRN   Alvester Morin, PA-C, DMSc, Nutritionist [facility] [phone]

## 2023-08-31 NOTE — Patient Instructions (Addendum)
-

## 2023-09-27 ENCOUNTER — Ambulatory Visit: Payer: Medicaid Other | Admitting: Nurse Practitioner

## 2023-11-09 ENCOUNTER — Ambulatory Visit: Payer: Self-pay | Admitting: Physician Assistant

## 2023-11-09 NOTE — Telephone Encounter (Signed)
  Chief Complaint: dizziness Symptoms: dizziness, lightheaded, possible low BP, fatigue Frequency: 3 months Pertinent Negatives: Patient denies fainting or movement related Disposition: [] ED /[] Urgent Care (no appt availability in office) / [x] Appointment(In office/virtual)/ []  [place] [facility]/ [] Home Care/ [] Refused Recommended Disposition /[] [place] [facility]/ []  Follow-up with PCP Additional Notes: Patient has been scheduled for evaluation    Copied from CRM [id number]. Topic: Clinical - Red Word Triage >> Nov 09, 2023  2:00 PM Clayton Bibles wrote: Red Word that prompted transfer to Nurse Triage: Low blood pressure but she does not remember what it was. It was checked by a nurse that's come to her home 1 time monthly. The nurse told her to get her blood pressure checked out. She gets dizzy; she has almost passed out, very tired. Reason for Disposition  [1] MILD dizziness (e.g., walking normally) AND [2] has NOT been evaluated by doctor (or NP/PA) for this  (Exception: Dizziness caused by heat exposure, sudden standing, or poor fluid intake.)  Answer Assessment - Initial Assessment Questions 1. DESCRIPTION: "Describe your dizziness."     Comes and goes- patient has physical job, patient feels faint in morning 2. LIGHTHEADED: "Do you feel lightheaded?" (e.g., somewhat faint, woozy, weak upon standing)     Yes- sometimes she has faintness at work 3. VERTIGO: "Do you feel like either you or the room is spinning or tilting?" (i.e. vertigo)     yes 4. SEVERITY: "How bad is it?"  "Do you feel like you are going to faint?" "Can you stand and walk?"   - MILD: Feels slightly dizzy, but walking normally.   - MODERATE: Feels unsteady when walking, but not falling; interferes with normal activities (e.g., school, work).   - SEVERE: Unable to walk without falling, or requires assistance to walk without falling; feels like passing out now.      mild 5. ONSET:  "When did the dizziness begin?"      3 months 6. AGGRAVATING FACTORS: "Does anything make it worse?" (e.g., standing, change in head position)     Just comes not related to movement 7. HEART RATE: "Can you tell me your heart rate?" "How many beats in 15 seconds?"  (Note: not all patients can do this)       Last BP was low- does not know reading 8. CAUSE: "What do you think is causing the dizziness?"     unsure  10. OTHER SYMPTOMS: "Do you have any other symptoms?" (e.g., fever, chest pain, vomiting, diarrhea, bleeding)       no  Protocols used: Dizziness - Lightheadedness-A-AH

## 2023-11-09 NOTE — Telephone Encounter (Signed)
Noted  Pt has an appt  KP 

## 2023-11-12 ENCOUNTER — Encounter: Payer: Self-pay | Admitting: Physician Assistant

## 2023-11-12 ENCOUNTER — Ambulatory Visit (INDEPENDENT_AMBULATORY_CARE_PROVIDER_SITE_OTHER): Payer: Medicaid Other | Admitting: Physician Assistant

## 2023-11-12 VITALS — BP 124/72 | HR 71 | Temp 98.2°F | Ht 65.0 in | Wt 157.0 lb

## 2023-11-12 DIAGNOSIS — G479 Sleep disorder, unspecified: Secondary | ICD-10-CM

## 2023-11-12 DIAGNOSIS — R768 Other specified abnormal immunological findings in serum: Secondary | ICD-10-CM | POA: Diagnosis not present

## 2023-11-12 DIAGNOSIS — R110 Nausea: Secondary | ICD-10-CM

## 2023-11-12 DIAGNOSIS — R5383 Other fatigue: Secondary | ICD-10-CM

## 2023-11-12 MED ORDER — HYDROXYZINE HCL 25 MG PO TABS: 25.0000 mg | ORAL_TABLET | Freq: Every evening | ORAL | 0 refills | Status: DC | PRN

## 2023-11-12 MED ORDER — ONDANSETRON HCL 8 MG PO TABS: 8.0000 mg | ORAL_TABLET | Freq: Three times a day (TID) | ORAL | 2 refills | Status: DC | PRN

## 2023-11-12 NOTE — Progress Notes (Signed)
 Date:  11/12/2023   Name:  Brittney Hampton   DOB:  March 02, 1966   MRN:  [id number]  Today's visit completed with the help of Spanish interpreter, but patient has good comprehension of English and can mostly communicate without interpreter.  Chief Complaint: Dizziness  Dizziness This is a new problem. Episode onset: 4 montsh. The problem occurs constantly. The problem has been gradually worsening. Associated symptoms include headaches, nausea and vertigo. She has tried lying down, rest, sleep, relaxation, ice and heat for the symptoms. The treatment provided mild relief.   Brittney Hampton presents today for evaluation of 3 months intermittent dizziness/nausea. Questions hypotension; apparently home health nurse said that her blood pressure was low.  Generalized fatigue.  Reports it is intermittent and spontaneous, not associated with any particular activities. Struggles to sleep because when she closes her eyes, she feels like she is moving. Has no insurance right now.   Known ANA positivity.  Initially planned to see rheumatology January 2025, but this appointment was canceled due to her lack of insurance. Complains of generalized fatigue, body aches, and watery eyes. No recent weight gain, slight weight loss of 4 lbs over the last 3 months.  History of multiple vitamin deficiencies.  Last visit she was advised to restart a multivitamin, but did not do so.  Vitamin labs have not been checked recently due to cost.  Continues to have epigastric pain, currently treated with PPI without complete resolution, has never had endoscopy or seen GI.  Medication list has been reviewed and updated.  Current Meds  Medication Sig   hydrOXYzine (ATARAX) 25 MG tablet Take 1 tablet (25 mg total) by mouth at bedtime as needed.   minoxidil (ROGAINE) 2 % external solution Apply topically 2 (two) times daily. To scalp for hair loss.   ondansetron (ZOFRAN) 8 MG tablet Take 1 tablet (8 mg total) by mouth every 8 (eight)  hours as needed for nausea or vomiting.   [DISCONTINUED] diclofenac Sodium (VOLTAREN) 1 % GEL Apply 2 g topically 4 (four) times daily. Use on affected joint up to 4x/day as needed. 2 grams is roughly 4 fingertips' worth of gel.     Review of Systems  Gastrointestinal:  Positive for nausea.  Neurological:  Positive for dizziness, vertigo and headaches.    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   Chronic pain of both knees 06/07/2022   Lumbar spondylolysis 02/25/2022   Osteopenia determined by x-ray 01/27/2022   Nodule of lower lobe of right lung 01/26/2022   Vitamin D deficiency 10/25/2021   Varicose veins of both lower extremities with pain 10/24/2021   GERD (gastroesophageal reflux disease) 10/24/2021   History of kidney stones 08/16/2021   Mild hyperlipidemia 08/16/2021   Insomnia 06/20/2021   Hepatic hemangioma 04/30/2021   Hepatic steatosis 04/30/2021   ANA positive 04/04/2021   B12 deficiency 04/02/2021   Joint pain 04/01/2021   IFG (impaired fasting glucose) 04/01/2021   Financial difficulties 04/01/2021   Aortic atherosclerosis (HCC) 03/31/2021    No Known Allergies  Immunization History  Administered Date(s) Administered   Influenza,inj,Quad PF,6+ Mos 06/20/2021   Influenza-Unspecified 05/27/2022   PFIZER(Purple Top)SARS-COV-2 Vaccination 04/08/2020, 05/10/2020, 12/28/2020   Td 05/27/2022  Tdap 05/27/2022   Zoster Recombinant(Shingrix) 05/27/2022    Past Surgical History:  Procedure Laterality Date   ABDOMINAL SURGERY     CESAREAN SECTION      Social History   Tobacco Use   Smoking status: Never   Smokeless tobacco: Never  Vaping Use   Vaping status: Never Used   Substance Use Topics   Alcohol use: Never   Drug use: Never    Family History  Problem Relation Age of Onset   Osteoarthritis Mother    Diabetes Father    Cancer Sister    Cancer Brother         11/12/2023    1:47 PM 08/31/2023   10:48 AM 08/16/2023    4:58 PM 07/05/2022    4:08 PM  GAD 7 : Generalized Anxiety Score  Nervous, Anxious, on Edge 0 0 0 0  Control/stop worrying 0 0 0 0  Worry too much - different things 0 0 0 0  Trouble relaxing 0 0 0 0  Restless 0 0 0 0  Easily annoyed or irritable 0 0 0 0  Afraid - awful might happen 0 0 0 0  Total GAD 7 Score 0 0 0 0  Anxiety Difficulty Not difficult at all Not difficult at all Not difficult at all Not difficult at all       11/12/2023    1:46 PM 08/31/2023   10:47 AM 08/16/2023    4:58 PM  Depression screen PHQ 2/9  Decreased Interest 0 0 0  Down, Depressed, Hopeless 0 0 0  PHQ - 2 Score 0 0 0  Altered sleeping  0 0  Tired, decreased energy  0 0  Change in appetite  0 0  Feeling bad or failure about yourself   0 0  Trouble concentrating  0 0  Moving slowly or fidgety/restless  0 0  Suicidal thoughts  0 0  PHQ-9 Score  0 0  Difficult doing work/chores  Not difficult at all Not difficult at all    BP Readings from Last 3 Encounters:  11/12/23 124/72  08/31/23 118/82  08/16/23 110/63    Wt Readings from Last 3 Encounters:  11/12/23 157 lb (71.2 kg)  08/31/23 161 lb (73 kg)  08/16/23 160 lb 12.8 oz (72.9 kg)    BP 124/72 (Cuff Size: Normal)   Pulse 71   Temp 98.2 F (36.8 C)   Ht 5\' 5"  (1.651 m)   Wt 157 lb (71.2 kg)   LMP 12/06/2016 (Approximate)   SpO2 96%   BMI 26.13 kg/m   Physical Exam Vitals and nursing note reviewed.  Constitutional:      Appearance: Normal appearance.  HENT:     Left Ear: A middle ear effusion (mild) is present.     Ears:     Comments: Dix-Hallpike negative bilaterally.  Cardiovascular:     Rate and Rhythm: Normal rate and regular rhythm.     Heart sounds: No murmur  heard.    No friction rub. No gallop.  Pulmonary:     Effort: Pulmonary effort is normal.     Breath sounds: Normal breath sounds.  Abdominal:     General: There is no distension.  Musculoskeletal:        General: Normal range of motion.     Comments: Right-sided back tenderness, generalized.   Skin:    General: Skin is warm and dry.  Neurological:     Mental Status: She is alert and oriented to person,  place, and time.     Gait: Gait is intact.  Psychiatric:        Mood and Affect: Mood and affect normal.     Recent Labs     Component Value Date/Time   NA 145 (H) 10/10/2022 1509   K 4.0 10/10/2022 1509   CL 106 10/10/2022 1509   CO2 24 10/10/2022 1509   GLUCOSE 130 (H) 10/10/2022 1509   GLUCOSE 127 (H) 12/03/2020 1312   BUN 10 10/10/2022 1509   CREATININE 0.70 10/10/2022 1509   CALCIUM 10.0 10/10/2022 1509   PROT 6.9 10/10/2022 1509   ALBUMIN 4.2 10/10/2022 1509   AST 17 10/10/2022 1509   ALT 16 10/10/2022 1509   ALKPHOS 81 10/10/2022 1509   BILITOT 0.6 10/10/2022 1509   GFRNONAA >60 12/03/2020 1312    Lab Results  Component Value Date   WBC 4.5 10/10/2022   HGB 14.3 10/10/2022   HCT 43.1 10/10/2022   MCV 90 10/10/2022   PLT 293 10/10/2022   Lab Results  Component Value Date   HGBA1C 5.5 01/25/2022   Lab Results  Component Value Date   CHOL 221 (H) 07/13/2021   HDL 66 07/13/2021   LDLCALC 137 (H) 07/13/2021   TRIG 103 07/13/2021   CHOLHDL 3.3 07/13/2021   Lab Results  Component Value Date   TSH 1.840 10/10/2022     Assessment and Plan:  1. Nausea (Primary) Prescribing ondansetron for as needed use.  Obtain basic lab work today.  Strongly encourage patient to obtain health insurance as she will need to see rheumatology and gastroenterology to fully evaluate her symptoms.  Referred to [email or web address].  Patient verbalizes understanding.  - Comprehensive metabolic panel - CBC with Differential/Platelet - TSH - ondansetron (ZOFRAN) 8 MG tablet;  Take 1 tablet (8 mg total) by mouth every 8 (eight) hours as needed for nausea or vomiting.  Dispense: 20 tablet; Refill: 2  2. Fatigue, unspecified type Check baseline labs as below.  Also advised again to begin multivitamin in this patient with known vitamin and mineral deficiencies. - Comprehensive metabolic panel - CBC with Differential/Platelet - TSH  3. Sleeping difficulty Prescribing hydroxyzine to aid with sleep, possibly with nausea as well. - hydrOXYzine (ATARAX) 25 MG tablet; Take 1 tablet (25 mg total) by mouth at bedtime as needed.  Dispense: 30 tablet; Refill: 0  4. ANA positive Needs to see rheumatology.  Will refer as soon as she is insured.  We reviewed today that she can see specialists without insurance, but out-of-pocket cost is high.  She would rather wait for insurance.   Return in about 4 weeks (around 12/10/2023) for OV f/u chronic conditions.    Alvester Morin, PA-C, DMSc, Nutritionist [facility] Primary Care and Sports Medicine [facility] [phone]

## 2023-11-13 LAB — CBC WITH DIFFERENTIAL/PLATELET
Basophils Absolute: 0 10*3/uL (ref 0.0–0.2)
Basos: 1 %
EOS (ABSOLUTE): 0.1 10*3/uL (ref 0.0–0.4)
Eos: 2 %
Hematocrit: 43.2 % (ref 34.0–46.6)
Hemoglobin: 14.5 g/dL (ref 11.1–15.9)
Immature Grans (Abs): 0 10*3/uL (ref 0.0–0.1)
Immature Granulocytes: 0 %
Lymphocytes Absolute: 2 10*3/uL (ref 0.7–3.1)
Lymphs: 38 %
MCH: 30.8 pg (ref 26.6–33.0)
MCHC: 33.6 g/dL (ref 31.5–35.7)
MCV: 92 fL (ref 79–97)
Monocytes Absolute: 0.4 10*3/uL (ref 0.1–0.9)
Monocytes: 7 %
Neutrophils Absolute: 2.7 10*3/uL (ref 1.4–7.0)
Neutrophils: 52 %
Platelets: 242 10*3/uL (ref 150–450)
RBC: 4.71 x10E6/uL (ref 3.77–5.28)
RDW: 12.3 % (ref 11.7–15.4)
WBC: 5.2 10*3/uL (ref 3.4–10.8)

## 2023-11-13 LAB — COMPREHENSIVE METABOLIC PANEL
ALT: 13 IU/L (ref 0–32)
AST: 19 IU/L (ref 0–40)
Albumin: 4.3 g/dL (ref 3.8–4.9)
Alkaline Phosphatase: 106 IU/L (ref 44–121)
BUN/Creatinine Ratio: 22 (ref 9–23)
BUN: 15 mg/dL (ref 6–24)
Bilirubin Total: 0.6 mg/dL (ref 0.0–1.2)
CO2: 24 mmol/L (ref 20–29)
Calcium: 9.3 mg/dL (ref 8.7–10.2)
Chloride: 106 mmol/L (ref 96–106)
Creatinine, Ser: 0.67 mg/dL (ref 0.57–1.00)
Globulin, Total: 2.5 g/dL (ref 1.5–4.5)
Glucose: 80 mg/dL (ref 70–99)
Potassium: 3.9 mmol/L (ref 3.5–5.2)
Sodium: 144 mmol/L (ref 134–144)
Total Protein: 6.8 g/dL (ref 6.0–8.5)
eGFR: 103 mL/min/{1.73_m2} (ref >59)

## 2023-11-13 LAB — TSH: TSH: 1.66 u[IU]/mL (ref 0.450–4.500)

## 2023-11-13 NOTE — Progress Notes (Signed)
 Please let Brittney Hampton know her labs returned in normal ranges and to continue care with her new provider, Dr. Mordecai Maes.  Have a great day!!

## 2023-12-05 ENCOUNTER — Ambulatory Visit
Admission: EM | Admit: 2023-12-05 | Discharge: 2023-12-05 | Disposition: A | Discharge status: Home / Self Care | Attending: Emergency Medicine | Admitting: Emergency Medicine

## 2023-12-05 DIAGNOSIS — K2990 Gastroduodenitis, unspecified, without bleeding: Secondary | ICD-10-CM

## 2023-12-05 DIAGNOSIS — K219 Gastro-esophageal reflux disease without esophagitis: Secondary | ICD-10-CM

## 2023-12-05 DIAGNOSIS — K2970 Gastritis, unspecified, without bleeding: Secondary | ICD-10-CM

## 2023-12-05 MED ORDER — ALUM & MAG HYDROXIDE-SIMETH 200-200-20 MG/5ML PO SUSP
30.0000 mL | Freq: Once | ORAL | Status: AC
  Administered 2023-12-05: 30 mL via ORAL

## 2023-12-05 MED ORDER — PANTOPRAZOLE SODIUM 40 MG PO TBEC: 40.0000 mg | DELAYED_RELEASE_TABLET | Freq: Every day | ORAL | 4 refills | Status: AC

## 2023-12-05 MED ORDER — LIDOCAINE VISCOUS HCL 2 % MT SOLN
15.0000 mL | Freq: Once | OROMUCOSAL | Status: AC
  Administered 2023-12-05: 15 mL via OROMUCOSAL

## 2023-12-05 NOTE — ED Triage Notes (Signed)
 Pt c/o abd cramping & burning x2 days. States pain comes & goes. Hx of GERD. Was seen by PCP on 3/3 for the same issue & was given zofran which she has not started d/t not knowing what's going on.

## 2023-12-05 NOTE — Discharge Instructions (Addendum)
 Resume taking your Protonix 40 mg at bedtime to help control your gastritis and reflux symptoms.  Follow the dietary restrictions given to you at discharge.  Make a follow-up appointment with your primary care provider to discuss ongoing management of your GERD as well as possible H. pylori testing.  I would also request a referral to gastroenterology for possible endoscopy.  If you develop any sharp severe abdominal pain, vomiting where you cannot keep down fluids, or you start vomiting blood or passing blood or dark tarry stools I recommend you go to the ER for evaluation.

## 2023-12-05 NOTE — ED Provider Notes (Signed)
 [facility]    CSN: [id number] Arrival date & time: 12/05/23  1212      History   Chief Complaint Chief Complaint  Patient presents with   Abdominal Cramping    HPI Brittney Hampton is a 58 y.o. female.   HPI  58 year old female with past medical history significant for migraine headaches, hyperlipidemia, GERD, fatty liver, and varicose veins presents for evaluation of burning in her upper abdomen and esophagus.  She reports that it burns every morning and over the last 2 to 3 days this become more of an intense cramping.  She does have some associated nausea and decreased appetite.  She denies any fever or vomiting.  She does not currently take any medication for her GERD.  She has not been evaluated by GI.  Past Medical History:  Diagnosis Date   Fatty liver    GERD (gastroesophageal reflux disease)    History of kidney stones    Hyperlipidemia    Migraine headache    fewer recently   Motion sickness    boats   Varicose vein of leg    Wears dentures    partial upper and lower    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   Chronic pain of both knees 06/07/2022   Lumbar spondylolysis 02/25/2022   Osteopenia determined by x-ray 01/27/2022   Nodule of lower lobe of right lung 01/26/2022   Vitamin D deficiency 10/25/2021   Varicose veins of both lower extremities with pain 10/24/2021   GERD (gastroesophageal reflux disease) 10/24/2021   History of kidney stones 08/16/2021   Mild hyperlipidemia 08/16/2021   Insomnia 06/20/2021   Hepatic hemangioma 04/30/2021   Hepatic steatosis 04/30/2021   ANA positive 04/04/2021   B12 deficiency 04/02/2021   Joint pain 04/01/2021   IFG  (impaired fasting glucose) 04/01/2021   Financial difficulties 04/01/2021   Aortic atherosclerosis (HCC) 03/31/2021    Past Surgical History:  Procedure Laterality Date   ABDOMINAL SURGERY     CESAREAN SECTION      OB History   No obstetric history on file.      Home Medications    Prior to Admission medications   Medication Sig Start Date End Date Taking? Authorizing Provider  hydrOXYzine (ATARAX) 25 MG tablet Take 1 tablet (25 mg total) by mouth at bedtime as needed. 11/12/23   Remo Lipps, PA  minoxidil (ROGAINE) 2 % external solution Apply topically 2 (two) times daily. To scalp for hair loss. 08/31/23   Remo Lipps, PA  ondansetron (ZOFRAN) 8 MG tablet Take 1 tablet (8 mg total) by mouth every 8 (eight) hours as needed for nausea or vomiting. 11/12/23   Remo Lipps, PA  pantoprazole (PROTONIX) 40 MG tablet Take 1 tablet (40 mg total) by mouth daily. 12/05/23   Becky Augusta, NP    Family History Family History  Problem Relation Age of Onset   Osteoarthritis Mother    Diabetes Father    Cancer Sister    Cancer Brother     Social History Social History   Tobacco Use   Smoking status: Never   Smokeless tobacco: Never  Vaping Use   Vaping status: Never  Used  Substance Use Topics   Alcohol use: Never   Drug use: Never     Allergies   Patient has no known allergies.   Review of Systems Review of Systems  Constitutional:  Positive for appetite change and diaphoresis. Negative for fever.       Decreased appetite, sweating when the cramping is intense.  Gastrointestinal:  Positive for abdominal pain and nausea. Negative for blood in stool, constipation, diarrhea and vomiting.  Neurological:  Positive for dizziness.     Physical Exam Triage Vital Signs ED Triage Vitals [12/05/23 1219]  Encounter Vitals Group     BP      Systolic BP Percentile      Diastolic BP Percentile      Pulse      Resp 16     Temp      Temp Source Oral     SpO2       Weight      Height      Head Circumference      Peak Flow      Pain Score      Pain Loc      Pain Education      Exclude from Growth Chart    No data found.  Updated Vital Signs BP 111/75 (BP Location: Left Arm)   Pulse 65   Temp 97.8 F (36.6 C) (Oral)   Resp 16   Ht 5\' 5"  (1.651 m)   Wt 158 lb 3.2 oz (71.8 kg)   LMP 12/06/2016 (Approximate)   SpO2 95%   BMI 26.33 kg/m   Visual Acuity Right Eye Distance:   Left Eye Distance:   Bilateral Distance:    Right Eye Near:   Left Eye Near:    Bilateral Near:     Physical Exam Vitals and nursing note reviewed.  Constitutional:      Appearance: Normal appearance. She is not ill-appearing.  HENT:     Head: Normocephalic and atraumatic.  Cardiovascular:     Rate and Rhythm: Normal rate.     Pulses: Normal pulses.     Heart sounds: Normal heart sounds. No murmur heard.    No friction rub. No gallop.  Pulmonary:     Effort: Pulmonary effort is normal.     Breath sounds: Normal breath sounds. No wheezing, rhonchi or rales.  Abdominal:     General: Abdomen is flat.     Palpations: Abdomen is soft.     Tenderness: There is abdominal tenderness. There is no guarding or rebound.     Comments: Patient has tenderness across her entire upper abdomen without guarding or rebound.  Negative Murphy sign.  Skin:    General: Skin is warm and dry.     Capillary Refill: Capillary refill takes less than 2 seconds.  Neurological:     General: No focal deficit present.     Mental Status: She is alert and oriented to person, place, and time.      UC Treatments / Results  Labs (all labs ordered are listed, but only abnormal results are displayed) Labs Reviewed - No data to display  EKG   Radiology No results found.  Procedures Procedures (including critical care time)  Medications Ordered in UC Medications  lidocaine (XYLOCAINE) 2 % viscous mouth solution 15 mL (15 mLs Mouth/Throat Given 12/05/23 1251)  alum & mag  hydroxide-simeth (MAALOX/MYLANTA) 200-200-20 MG/5ML suspension 30 mL (30 mLs Oral Given 12/05/23 1251)    Initial Impression / Assessment  and Plan / UC Course  I have reviewed the triage vital signs and the nursing notes.  Pertinent labs & imaging results that were available during my care of the patient were reviewed by me and considered in my medical decision making (see chart for details).   Patient is a pleasant, nontoxic-appearing 58 year old female presenting for evaluation of burning and cramping in her upper abdomen as outlined HPI above.  She does have a history of GERD but is not currently on a PPI or H2 blocker.  She reports that she did talk to her PCP at [facility] about the symptoms earlier this month.  She was prescribed Zofran but did not start taking it because she is unsure of what has been the cause of her symptoms.  She states that she does not have an appetite and is nauseous.  The pain does not increase or improve with food.  She denies any diarrhea, bloody stools, or dark tarry stools.  She has never seen GI.  On exam her abdomen is soft and flat with mild upper abdominal tenderness without focal finding.  She has a negative Murphy sign.  I suspect that the patient symptoms are result of gastritis.  I will have staff administer GI cocktail to see if it improves her symptoms.  Other differential diagnose include H. pylori infection or peptic ulcer disease.  Peptic ulcer disease is less likely given the absence of melena or dark tarry stools.  The patient reports that her symptoms improved with the GI cocktail.  I will discharge her home with a diagnosis of gastritis and resume her PPI.  She was previously on Protonix.  I will also have her follow-up with her primary care provider for referral to gastroenterology for possible endoscopy.  Also follow-up for possible H. pylori testing.   Final Clinical Impressions(s) / UC Diagnoses   Final diagnoses:  Gastritis and  gastroduodenitis  Gastroesophageal reflux disease without esophagitis     Discharge Instructions      Resume taking your Protonix 40 mg at bedtime to help control your gastritis and reflux symptoms.  Follow the dietary restrictions given to you at discharge.  Make a follow-up appointment with your primary care provider to discuss ongoing management of your GERD as well as possible H. pylori testing.  I would also request a referral to gastroenterology for possible endoscopy.  If you develop any sharp severe abdominal pain, vomiting where you cannot keep down fluids, or you start vomiting blood or passing blood or dark tarry stools I recommend you go to the ER for evaluation.     ED Prescriptions     Medication Sig Dispense Auth. Provider   pantoprazole (PROTONIX) 40 MG tablet Take 1 tablet (40 mg total) by mouth daily. 90 tablet Becky Augusta, NP      PDMP not reviewed this encounter.   Becky Augusta, NP 12/05/23 1300

## 2023-12-10 ENCOUNTER — Encounter: Payer: Self-pay | Admitting: Physician Assistant

## 2023-12-10 ENCOUNTER — Ambulatory Visit: Payer: Self-pay | Admitting: Physician Assistant

## 2024-02-07 ENCOUNTER — Ambulatory Visit (INDEPENDENT_AMBULATORY_CARE_PROVIDER_SITE_OTHER): Payer: Self-pay | Admitting: Physician Assistant

## 2024-02-07 ENCOUNTER — Encounter: Payer: Self-pay | Admitting: Physician Assistant

## 2024-02-07 ENCOUNTER — Ambulatory Visit: Payer: Self-pay | Admitting: Physician Assistant

## 2024-02-07 VITALS — BP 110/70 | HR 69 | Temp 98.0°F | Ht 65.0 in | Wt 160.0 lb

## 2024-02-07 DIAGNOSIS — M5431 Sciatica, right side: Secondary | ICD-10-CM | POA: Insufficient documentation

## 2024-02-07 DIAGNOSIS — Z5971 Insufficient health insurance coverage: Secondary | ICD-10-CM

## 2024-02-07 MED ORDER — GABAPENTIN 300 MG PO CAPS: 300.0000 mg | ORAL_CAPSULE | Freq: Three times a day (TID) | ORAL | 1 refills | Status: DC | PRN

## 2024-02-07 NOTE — Progress Notes (Signed)
 Date:  02/07/2024   Name:  Brittney Hampton   DOB:  02/21/1966   MRN:  [id number]  Today's visit completed with assistance from Spanish interpreter.  Chief Complaint: Medical Management of Chronic Issues, Nausea (Still feeling nauseous, No vomiting ), and Back Pain (Still having lower back pain, numbness from lower back to foot on right side, walks fine but when sitting down pain is present, burning, 8 on pain scale )  HPI Brittney Hampton presents with lower back pain radiating all the way down the right leg and into the foot worse with sitting, better when ambulating.   GI issues not much changed. Still does not have insurance so still has not seen GI.  Still having hair loss, but has not yet seen dermatology due to cost.    Medication list has been reviewed and updated.  Current Meds  Medication Sig   gabapentin  (NEURONTIN ) 300 MG capsule Take 1 capsule (300 mg total) by mouth 3 (three) times daily as needed.   minoxidil  (ROGAINE ) 2 % external solution Apply topically 2 (two) times daily. To scalp for hair loss.   mupirocin  ointment (BACTROBAN ) 2 % Apply 1 Application topically as needed.   pantoprazole  (PROTONIX ) 40 MG tablet Take 1 tablet (40 mg total) by mouth daily.   [DISCONTINUED] ondansetron  (ZOFRAN ) 8 MG tablet Take 1 tablet (8 mg total) by mouth every 8 (eight) hours as needed for nausea or vomiting.     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 10/24/2021   GERD (gastroesophageal reflux disease) 10/24/2021   History of kidney stones 08/16/2021   Mild hyperlipidemia 08/16/2021   Insomnia 06/20/2021   Hepatic hemangioma 04/30/2021   Hepatic steatosis 04/30/2021   ANA positive 04/04/2021   B12 deficiency 04/02/2021   Joint pain 04/01/2021   IFG (impaired fasting glucose) 04/01/2021   Financial difficulties 04/01/2021   Aortic atherosclerosis (HCC) 03/31/2021    No Known Allergies  Immunization History  Administered Date(s) Administered   Influenza,inj,Quad PF,6+ Mos 06/20/2021   Influenza-Unspecified 05/27/2022   PFIZER(Purple Top)SARS-COV-2 Vaccination 04/08/2020, 05/10/2020, 12/28/2020   Td 05/27/2022   Tdap 05/27/2022   Zoster Recombinant(Shingrix) 05/27/2022    Past Surgical History:  Procedure Laterality Date   ABDOMINAL SURGERY     CESAREAN SECTION      Social History   Tobacco Use   Smoking status: Never   Smokeless tobacco: Never  Vaping Use   Vaping status: Never Used  Substance Use Topics   Alcohol use: Never   Drug use: Never    Family History  Problem Relation Age of Onset   Osteoarthritis Mother    Diabetes Father    Cancer Sister    Cancer Brother         02/07/2024    2:26 PM 11/12/2023    1:47 PM 08/31/2023   10:48 AM 08/16/2023  4:58 PM  GAD 7 : Generalized Anxiety Score  Nervous, Anxious, on Edge 0 0 0 0  Control/stop worrying 0 0 0 0  Worry too much - different things 0 0 0 0  Trouble relaxing 0 0 0 0  Restless 0 0 0 0  Easily annoyed or irritable 0 0 0 0  Afraid - awful might happen 0 0 0 0  Total GAD 7 Score 0 0 0 0  Anxiety Difficulty Not difficult at all Not difficult at all Not difficult at all Not difficult at all       02/07/2024    2:26 PM 11/12/2023    1:46 PM 08/31/2023   10:47 AM  Depression screen PHQ 2/9  Decreased Interest 0 0 0  Down, Depressed, Hopeless 0 0 0  PHQ - 2 Score 0 0 0   Altered sleeping   0  Tired, decreased energy   0  Change in appetite   0  Feeling bad or failure about yourself    0  Trouble concentrating   0  Moving slowly or fidgety/restless   0  Suicidal thoughts   0  PHQ-9 Score   0  Difficult doing work/chores   Not difficult at all    BP Readings from Last 3 Encounters:  02/07/24 110/70  12/05/23 111/75  11/12/23 124/72    Wt Readings from Last 3 Encounters:  02/07/24 160 lb (72.6 kg)  12/05/23 158 lb 3.2 oz (71.8 kg)  11/12/23 157 lb (71.2 kg)    BP 110/70   Pulse 69   Temp 98 F (36.7 C)   Ht 5\' 5"  (1.651 m)   Wt 160 lb (72.6 kg)   LMP 12/06/2016 (Approximate)   SpO2 98%   BMI 26.63 kg/m   Physical Exam Vitals and nursing note reviewed.  Constitutional:      Appearance: Normal appearance.  Cardiovascular:     Rate and Rhythm: Normal rate and regular rhythm.     Heart sounds: No murmur heard.    No friction rub. No gallop.  Pulmonary:     Effort: Pulmonary effort is normal.     Breath sounds: Normal breath sounds.  Abdominal:     General: There is no distension.  Musculoskeletal:        General: Normal range of motion.  Skin:    General: Skin is warm and dry.  Neurological:     Mental Status: She is alert and oriented to person, place, and time.     Gait: Gait is intact.  Psychiatric:        Mood and Affect: Mood and affect normal.     Recent Labs     Component Value Date/Time   NA 144 11/12/2023 1513   K 3.9 11/12/2023 1513   CL 106 11/12/2023 1513   CO2 24 11/12/2023 1513   GLUCOSE 80 11/12/2023 1513   GLUCOSE 127 (H) 12/03/2020 1312   BUN 15 11/12/2023 1513   CREATININE 0.67 11/12/2023 1513   CALCIUM 9.3 11/12/2023 1513   PROT 6.8 11/12/2023 1513   ALBUMIN 4.3 11/12/2023 1513   AST 19 11/12/2023 1513   ALT 13 11/12/2023 1513   ALKPHOS 106 11/12/2023 1513   BILITOT 0.6 11/12/2023 1513   GFRNONAA >60 12/03/2020 1312    Lab Results  Component Value Date   WBC 5.2 11/12/2023   HGB 14.5  11/12/2023   HCT 43.2 11/12/2023   MCV 92 11/12/2023   PLT 242 11/12/2023   Lab  Results  Component Value Date   HGBA1C 5.5 01/25/2022   Lab Results  Component Value Date   CHOL 221 (H) 07/13/2021   HDL 66 07/13/2021   LDLCALC 137 (H) 07/13/2021   TRIG 103 07/13/2021   CHOLHDL 3.3 07/13/2021   Lab Results  Component Value Date   TSH 1.660 11/12/2023     Assessment and Plan:  Sciatica of right side Assessment & Plan: Patient education printed. Discussed conservative measures to include stretching, heat, and avoidance of hard surfaces. Also consider OTC NSAIDs such as ibuprofen or naproxen . Sending gabapentin  as well, though cautioned this may make her sleepy.   Orders: -     Gabapentin ; Take 1 capsule (300 mg total) by mouth 3 (three) times daily as needed.  Dispense: 30 capsule; Refill: 1  Uninsured -     AMB Referral [facility] Care Management     Return if symptoms worsen or fail to improve.    Cody Das, PA-C, DMSc, Nutritionist [facility] [phone]

## 2024-02-07 NOTE — Assessment & Plan Note (Addendum)
 Patient education printed. Discussed conservative measures to include stretching, heat, and avoidance of hard surfaces. Also consider OTC NSAIDs such as ibuprofen or naproxen . Sending gabapentin  as well, though cautioned this may make her sleepy.

## 2024-02-08 ENCOUNTER — Telehealth: Payer: Self-pay

## 2024-02-08 NOTE — Progress Notes (Signed)
 Complex Care Management Note Care Guide Note  02/08/2024 Name: Brittney Hampton MRN: [id number] DOB: 1966-08-06   Complex Care Management Outreach Attempts: An unsuccessful telephone outreach was attempted today to offer the patient information about available complex care management services.  Follow Up Plan:  Additional outreach attempts will be made to offer the patient complex care management information and services.   Encounter Outcome:  No Answer  Lenton Rail , RMA     [place]  [facility], [facility] Guide  Direct Dial: [phone]  Website: [email or web address]

## 2024-02-12 NOTE — Progress Notes (Signed)
 Complex Care Management Note Care Guide Note  02/12/2024 Name: Brittney Hampton MRN: [id number] DOB: 04/24/1966   Complex Care Management Outreach Attempts: A second unsuccessful outreach was attempted today to offer the patient with information about available complex care management services.  Follow Up Plan:  Additional outreach attempts will be made to offer the patient complex care management information and services.   Encounter Outcome:  No Answer  Lenton Rail , RMA     [place]  [facility], [facility] Guide  Direct Dial: [phone]  Website: [email or web address]

## 2024-02-25 NOTE — Progress Notes (Signed)
 Complex Care Management Note Care Guide Note  02/25/2024 Name: Brittney Hampton MRN: [id number] DOB: 01-Oct-1965   Complex Care Management Outreach Attempts: A third unsuccessful outreach was attempted today to offer the patient with information about available complex care management services.  Follow Up Plan:  No further outreach attempts will be made at this time. We have been unable to contact the patient to offer or enroll patient in complex care management services.  Encounter Outcome:  No Answer  Lenton Rail , RMA       [facility], [facility] Guide  Direct Dial: [phone]  Website: [email or web address]

## 2024-04-21 ENCOUNTER — Ambulatory Visit
Admission: EM | Admit: 2024-04-21 | Discharge: 2024-04-21 | Disposition: A | Discharge status: Home / Self Care | Attending: Physician Assistant | Admitting: Physician Assistant

## 2024-04-21 DIAGNOSIS — M5441 Lumbago with sciatica, right side: Secondary | ICD-10-CM

## 2024-04-21 DIAGNOSIS — G8929 Other chronic pain: Secondary | ICD-10-CM

## 2024-04-21 MED ORDER — GABAPENTIN 300 MG PO CAPS
300.0000 mg | ORAL_CAPSULE | Freq: Every evening | ORAL | 0 refills | Status: AC | PRN
Start: 2024-04-21 — End: ?

## 2024-04-21 MED ORDER — MELOXICAM 15 MG PO TABS: 15.0000 mg | ORAL_TABLET | Freq: Every day | ORAL | 0 refills | Status: DC

## 2024-04-21 NOTE — ED Provider Notes (Signed)
 [facility]    CSN: [id number] Arrival date & time: 04/21/24  1118      History   Chief Complaint Chief Complaint  Patient presents with   Back Pain    HPI Brittney Hampton is a 58 y.o. female presenting for >1 year history of central lower back pain with radiation to the right lower extremity. Brittney Hampton also reports occasional numbness in the leg. No weakness, falls or trauma. Increased pain with prolonged sitting, leaning forward and raising right leg. Has tried OTC meds and gabapentin  but can only take gabapentin  at night because it makes Brittney Hampton dizzy and tired during the day. Has not seen an orthopedic provider for Brittney Hampton chronic back pain but did see PCP about 2 months ago and that is when Brittney Hampton was started on the gabapentin .    HPI  Past Medical History:  Diagnosis Date   Fatty liver    GERD (gastroesophageal reflux disease)    History of kidney stones    Hyperlipidemia    Migraine headache    fewer recently   Motion sickness    boats   Varicose vein of leg    Wears dentures    partial upper and lower    Patient Active Problem List   Diagnosis Date Noted   Sciatica of right side 02/07/2024   Muscle tension headache 08/31/2023   Hair loss 08/31/2023   Chronic midline back pain 08/31/2023   Paresthesia of upper and lower extremities of both sides 08/16/2023   Vision changes 08/16/2023   Eye muscle twitches 08/16/2023   Female pattern hair loss 08/02/2022   Chronic pain of both knees 06/07/2022   Lumbar spondylolysis 02/25/2022   Osteopenia determined by x-ray 01/27/2022   Nodule of lower lobe of right lung 01/26/2022   Vitamin D  deficiency 10/25/2021   Varicose veins of both lower extremities with pain 10/24/2021   GERD (gastroesophageal reflux disease) 10/24/2021   History of kidney stones 08/16/2021   Mild hyperlipidemia 08/16/2021   Insomnia 06/20/2021   Hepatic hemangioma 04/30/2021   Hepatic steatosis 04/30/2021   ANA positive 04/04/2021   B12  deficiency 04/02/2021   Joint pain 04/01/2021   IFG (impaired fasting glucose) 04/01/2021   Financial difficulties 04/01/2021   Aortic atherosclerosis (HCC) 03/31/2021    Past Surgical History:  Procedure Laterality Date   ABDOMINAL SURGERY     CESAREAN SECTION      OB History   No obstetric history on file.      Home Medications    Prior to Admission medications   Medication Sig Start Date End Date Taking? Authorizing Provider  gabapentin  (NEURONTIN ) 300 MG capsule Take 1 capsule (300 mg total) by mouth at bedtime as needed. 04/21/24  Yes Arvis Jolan NOVAK, PA-C  meloxicam  (MOBIC ) 15 MG tablet Take 1 tablet (15 mg total) by mouth daily. 04/21/24  Yes Arvis Jolan B, PA-C  hydrOXYzine  (ATARAX ) 25 MG tablet Take 1 tablet (25 mg total) by mouth at bedtime as needed. Patient not taking: Reported on 02/07/2024 11/12/23   Manya Toribio SQUIBB, PA  minoxidil  (ROGAINE ) 2 % external solution Apply topically 2 (two) times daily. To scalp for hair loss. 08/31/23   Manya Toribio SQUIBB, PA  mupirocin  ointment (BACTROBAN ) 2 % Apply 1 Application topically as needed. 11/10/22   [provider]  pantoprazole  (PROTONIX ) 40 MG tablet Take 1 tablet (40 mg total) by mouth daily. 12/05/23   Bernardino Ditch, NP    Family History Family History  Problem  Relation Age of Onset   Osteoarthritis Mother    Diabetes Father    Cancer Sister    Cancer Brother     Social History Social History   Tobacco Use   Smoking status: Never   Smokeless tobacco: Never  Vaping Use   Vaping status: Never Used  Substance Use Topics   Alcohol use: Never   Drug use: Never     Allergies   Patient has no known allergies.   Review of Systems Review of Systems  Musculoskeletal:  Positive for arthralgias and back pain. Negative for gait problem.  Neurological:  Positive for numbness. Negative for weakness.     Physical Exam Triage Vital Signs ED Triage Vitals  Encounter Vitals Group     BP --      Girls  Systolic BP Percentile --      Girls Diastolic BP Percentile --      Boys Systolic BP Percentile --      Boys Diastolic BP Percentile --      Pulse --      Resp 04/21/24 1139 16     Temp --      Temp Source 04/21/24 1139 Oral     SpO2 --      Weight 04/21/24 1138 160 lb 9.6 oz (72.8 kg)     Height --      Head Circumference --      Peak Flow --      Pain Score --      Pain Loc --      Pain Education --      Exclude from Growth Chart --    No data found.  Updated Vital Signs BP 122/75 (BP Location: Right Arm)   Pulse 62   Temp 98.5 F (36.9 C) (Oral)   Resp 16   Ht 5' 5 (1.651 m)   Wt 160 lb 9.6 oz (72.8 kg)   LMP 12/06/2016 (Approximate)   SpO2 95%   BMI 26.73 kg/m   Physical Exam Vitals and nursing note reviewed.  Constitutional:      General: Brittney Hampton is not in acute distress.    Appearance: Normal appearance. Brittney Hampton is not ill-appearing or toxic-appearing.  HENT:     Head: Normocephalic and atraumatic.  Eyes:     General: No scleral icterus.       Right eye: No discharge.        Left eye: No discharge.     Conjunctiva/sclera: Conjunctivae normal.  Cardiovascular:     Rate and Rhythm: Normal rate.  Pulmonary:     Effort: Pulmonary effort is normal. No respiratory distress.  Musculoskeletal:     Cervical back: Neck supple.     Lumbar back: Bony tenderness (TTP L2-L5) present. Decreased range of motion. Positive right straight leg raise test. Negative left straight leg raise test.  Skin:    General: Skin is dry.  Neurological:     General: No focal deficit present.     Mental Status: Brittney Hampton is alert. Mental status is at baseline.     Motor: No weakness.     Gait: Gait normal.  Psychiatric:        Mood and Affect: Mood normal.        Behavior: Behavior normal.      UC Treatments / Results  Labs (all labs ordered are listed, but only abnormal results are displayed) Labs Reviewed - No data to display  EKG   Radiology No results  found.  Procedures  Procedures (including critical care time)  Medications Ordered in UC Medications - No data to display  Initial Impression / Assessment and Plan / UC Course  I have reviewed the triage vital signs and the nursing notes.  Pertinent labs & imaging results that were available during my care of the patient were reviewed by me and considered in my medical decision making (see chart for details).   58 y/o female presents for >1 year history of lower back pain with right sided radiculopathy. Seen by PCP 2 months ago for back pain. Started on gabapentin  and reports taking it at night as needed. Also tried OTC meds but has not seen ortho provider or had MRI. No weakness of legs or loss of bowel/bladder control.  Reviewed PCP note from 02/07/24.  Chronic lower back pain with lumbar radiculopathy. Advised to continue gabapentin  at bed time. Start Meloxicam  daily. Reviewed supportive care. Advised ortho follow up and provided Brittney Hampton with contact information. Reviewed return and ED precautions. Work note given.   Final Clinical Impressions(s) / UC Diagnoses   Final diagnoses:  Chronic midline low back pain with right-sided sciatica     Discharge Instructions      DOLOR DE ESPALDA: Estrs por evitar actividades dolorosas. Se revisaron las pautas de RICE (REPOSO, HIELO, COMPRESIN, ELEVACIN). Se puede alternar hielo y calor. Considere el uso de frotaciones musculares, parches Salonpas, etc. Tome los medicamentos segn las indicaciones, incluyendo relajantes musculares si se los recetaron. Tome antiinflamatorios segn lo recetado o AINE/Tylenol  de [employer]. No se preocupe por la ortopedia. Llame a una de las [employer] se indican a continuacin.  SEALES DE SEAL DE DOLOR DE ESPALDA: Si el dolor de espalda empeora de forma aguda o presenta alguna seal de alerta, como entumecimiento/hormigueo, debilidad en las piernas, anestesia general o prdida del control intestinal/vesical,  acuda de inmediato a urgencias. Acuda a su cita de seguimiento segn lo programado o antes si el dolor no disminuye o empeora antes.  Si tiene una afeccin que requiere una cita de seguimiento con un especialista en ortopedia, llame a una de las siguientes clnicas para solicitar una cita:  [facility]: [address], [place], [place] [address] [place]: [phone]  [facility] [address], [place], [place] [address] [place]: [phone]  [facility] [address], [place], [place] [address] [place]: [phone]   BACK PAIN: Stressed avoiding painful activities . RICE (REST, ICE, COMPRESSION, ELEVATION) guidelines reviewed. May alternate ice and heat. Consider use of muscle rubs, Salonpas patches, etc. Use medications as directed including muscle relaxers if prescribed. Take anti-inflammatory medications as prescribed or OTC NSAIDs/Tylenol .  F/u with orthopedics. Call one of the offices below.  BACK PAIN RED FLAGS: If the back pain acutely worsens or there are any red flag symptoms such as numbness/tingling, leg weakness, saddle anesthesia, or loss of bowel/bladder control, go immediately to the ER. Follow up with us  as scheduled or sooner if the pain does not begin to resolve or if it worsens before the follow up    You have a condition requiring you to follow up with Orthopedics so please call one of the following office for appointment:   [facility] Address: [address], [place], [place] [address] Phone: [phone]  [facility] [address], [place], [place] [address] Phone: [phone]  [facility] [address] [place], [place] [address] Phone: [phone]      ED Prescriptions     Medication Sig Dispense Auth. Provider  gabapentin  (NEURONTIN ) 300 MG capsule Take 1 capsule (300 mg total) by mouth at bedtime as needed. 30 capsule Arvis Huxley B, PA-C   meloxicam  (MOBIC ) 15 MG tablet Take 1 tablet (15 mg total) by mouth daily. 30 tablet  Arvis Huxley NOVAK, PA-C      I have reviewed the PDMP during this encounter.   Arvis Huxley NOVAK, PA-C 04/21/24 1216

## 2024-04-21 NOTE — Discharge Instructions (Signed)
 DOLOR DE ESPALDA: Estrs por evitar actividades dolorosas. Se revisaron las pautas de RICE (REPOSO, HIELO, COMPRESIN, ELEVACIN). Se puede alternar hielo y calor. Considere el uso de frotaciones musculares, parches Salonpas, etc. Tome los medicamentos segn las indicaciones, incluyendo relajantes musculares si se los recetaron. Tome antiinflamatorios segn lo recetado o AINE/Tylenol  de [employer]. No se preocupe por la ortopedia. Llame a una de las [employer] se indican a continuacin.  SEALES DE SEAL DE DOLOR DE ESPALDA: Si el dolor de espalda empeora de forma aguda o presenta alguna seal de alerta, como entumecimiento/hormigueo, debilidad en las piernas, anestesia general o prdida del control intestinal/vesical, acuda de inmediato a urgencias. Acuda a su cita de seguimiento segn lo programado o antes si el dolor no disminuye o empeora antes.  Si tiene una afeccin que requiere una cita de seguimiento con un especialista en ortopedia, llame a una de las siguientes clnicas para solicitar una cita:  [facility]: [address], [place], [place] [address] [place]: [phone]  [facility] [address], [place], [place] [address] [place]: [phone]  [facility] [address], [place], [place] [address] [place]: [phone]   BACK PAIN: Stressed avoiding painful activities . RICE (REST, ICE, COMPRESSION, ELEVATION) guidelines reviewed. May alternate ice and heat. Consider use of muscle rubs, Salonpas patches, etc. Use medications as directed including muscle relaxers if prescribed. Take anti-inflammatory medications as prescribed or OTC NSAIDs/Tylenol .  F/u with orthopedics. Call one of the offices below.  BACK PAIN RED FLAGS: If the back pain acutely worsens or there are any red flag symptoms such as numbness/tingling, leg weakness, saddle anesthesia, or loss of bowel/bladder control, go immediately to the ER. Follow up with us  as scheduled or sooner if the pain does  not begin to resolve or if it worsens before the follow up    You have a condition requiring you to follow up with Orthopedics so please call one of the following office for appointment:   [facility] Address: [address], [place], [place] [address] Phone: [phone]  [facility] [address], [place], [place] [address] Phone: [phone]  [facility] [address], [place], [place] [address] Phone: [phone]

## 2024-04-21 NOTE — ED Triage Notes (Signed)
 Pt c/o back pain radiating down legs x1 yr. States pain is sharp at times which causes numbness. Has tried gabapentin  w/minor relief.

## 2024-05-10 ENCOUNTER — Other Ambulatory Visit: Payer: Self-pay

## 2024-05-10 ENCOUNTER — Emergency Department
Admission: EM | Admit: 2024-05-10 | Discharge: 2024-05-10 | Disposition: A | Discharge status: Home / Self Care | Payer: Self-pay | Attending: Emergency Medicine | Admitting: Emergency Medicine

## 2024-05-10 ENCOUNTER — Emergency Department: Payer: Self-pay

## 2024-05-10 DIAGNOSIS — M47816 Spondylosis without myelopathy or radiculopathy, lumbar region: Secondary | ICD-10-CM | POA: Diagnosis not present

## 2024-05-10 DIAGNOSIS — M545 Low back pain, unspecified: Secondary | ICD-10-CM | POA: Diagnosis present

## 2024-05-10 DIAGNOSIS — M5442 Lumbago with sciatica, left side: Secondary | ICD-10-CM | POA: Diagnosis not present

## 2024-05-10 DIAGNOSIS — R202 Paresthesia of skin: Secondary | ICD-10-CM

## 2024-05-10 HISTORY — DX: Disorder of kidney and ureter, unspecified: N28.9

## 2024-05-10 MED ORDER — PREDNISONE 10 MG (21) PO TBPK: ORAL_TABLET | ORAL | 0 refills | Status: DC

## 2024-05-10 MED ORDER — KETOROLAC TROMETHAMINE 30 MG/ML IJ SOLN
30.0000 mg | Freq: Once | INTRAMUSCULAR | Status: AC
  Administered 2024-05-10: 30 mg via INTRAMUSCULAR
  Filled 2024-05-10: qty 1

## 2024-05-10 MED ORDER — METHOCARBAMOL 500 MG PO TABS
500.0000 mg | ORAL_TABLET | Freq: Once | ORAL | Status: AC
  Administered 2024-05-10: 500 mg via ORAL
  Filled 2024-05-10: qty 1

## 2024-05-10 MED ORDER — METHOCARBAMOL 500 MG PO TABS: 500.0000 mg | ORAL_TABLET | Freq: Three times a day (TID) | ORAL | 0 refills | Status: DC | PRN

## 2024-05-10 NOTE — ED Triage Notes (Signed)
 Pt to ED for LLE numbness and pain since yesterday with hx cramping to same leg since a long time (1 year atleast). Has been told by PCP that needs B12 injections. Pt is limping. No redness or swelling noted. Pain is sharp. No difference with walking. Also feels nauseous, no abdominal pain. Pt also has metallic taste in mouth. Denies new onset urinary or bowel incontinence.

## 2024-05-10 NOTE — ED Provider Notes (Signed)
 [facility] Provider Note    Event Date/Time   First MD Initiated Contact with Patient 05/10/24 [phone]     (approximate)   History   Leg Pain   HPI  Kasen Sako is a 58 y.o. female with history of B12 deficiency, GERD, sciatica migraine and as listed in EMR presents to the emergency department for treatment and evaluation of pain in the left lower back/hip that radiates into the thigh. No known injury. She has had similar symptoms on the right side. Also, she is having intermittent tingling to the upper extremities. She had been taking B12 gummies, but stopped taking them due to nausea.     Physical Exam    Vitals:   05/10/24 0849 05/10/24 1050  BP:  107/65  Pulse:  62  Resp:  18  Temp:    SpO2: 98% 100%    General: Awake, no distress.  CV:  Good peripheral perfusion.  Resp:  Normal effort.  Abd:  No distention.  Other:  Positive straight leg raise on left.    ED Results / Procedures / Treatments   Labs (all labs ordered are listed, but only abnormal results are displayed)  Labs Reviewed - No data to display   EKG  Not indicated   RADIOLOGY  Image and radiology report reviewed and interpreted by me. Radiology report consistent with the same.  Multilevel endplate degenerative changes of the lumbar spine and facet arthropathy noted on x-ray of the lumbar spine.  PROCEDURES:  Critical Care performed: No  Procedures   MEDICATIONS ORDERED IN ED:  Medications  ketorolac  (TORADOL ) 30 MG/ML injection 30 mg (30 mg Intramuscular Given 05/10/24 0913)  methocarbamol  (ROBAXIN ) tablet 500 mg (500 mg Oral Given by Other 05/10/24 0915)     IMPRESSION / MDM / ASSESSMENT AND PLAN / ED COURSE   I have reviewed the triage note and vital signs. Vital signs stable   Differential diagnosis includes, but is not limited to, degenerative changes, sciatica, muscle spasm  Patient's presentation is most consistent with acute illness /  injury with system symptoms.  58 year old female presents to the emergency department for treatment and evaluation of back pain that started yesterday  and intermittent paresthesias. See HPI.  According to the triage note, she is also experiencing nausea and a strange taste in her mouth. This was due to the B12 gummies that she stopped taking.   X-ray of the lumbar spine shows facet arthropathy and multilevel endplate degenerative changes.  While here, patient was given Toradol  and methocarbamol  with relief of pain.  X-ray results were discussed with her and her son.  She will be advised to follow-up with primary care or orthopedics.  Paresthesias in her arms/hands are likely secondary to her B12 deficiency.  She was encouraged to discuss this with her primary care provider to determine if B12 injections would be more beneficial.  Prescription for taper prednisone  and Robaxin  submitted to patient's pharmacy.  Work note provided for today and tomorrow as well.      FINAL CLINICAL IMPRESSION(S) / ED DIAGNOSES   Final diagnoses:  Facet arthropathy, lumbar  Acute back pain with sciatica, left  Paresthesia     Rx / DC Orders   ED Discharge Orders          Ordered    methocarbamol  (ROBAXIN ) 500 MG tablet  Every 8 hours PRN        05/10/24 1039    predniSONE  (STERAPRED UNI-PAK 21  TAB) 10 MG (21) TBPK tablet        05/10/24 1039             Note:  This document was prepared using Dragon voice recognition software and may include unintentional dictation errors.   Herlinda Kirk NOVAK, FNP 05/10/24 1103    Dicky Anes, MD 05/10/24 1332

## 2024-05-13 ENCOUNTER — Telehealth: Payer: Self-pay

## 2024-05-13 NOTE — Transitions of Care (Post Inpatient/ED Visit) (Signed)
   05/13/2024  Name: Brittney Hampton MRN: [id number] DOB: 31-May-1966  Today's TOC FU Call Status: Today's TOC FU Call Status:: Successful TOC FU Call Completed TOC FU Call Complete Date: 05/13/24 Patient's Name and Date of Birth confirmed.  Transition Care Management Follow-up Telephone Call Date of Discharge: 05/10/24 Discharge Facility: [facility]) Type of Discharge: Emergency Department Reason for ED Visit: Other: (Spondylosis without myelopathy or radiculopathy, lumbar region) How have you been since you were released from the hospital?: Better Any questions or concerns?: No  Items Reviewed: Did you receive and understand the discharge instructions provided?: Yes Medications obtained,verified, and reconciled?: Yes (Medications Reviewed) Any new allergies since your discharge?: No Dietary orders reviewed?: NA Do you have support at home?: Yes  Medications Reviewed Today: Medications Reviewed Today     Reviewed by Nollie Shiflett, Steva SAILOR, CMA (Certified Medical Assistant) on 05/13/24 at 0908  Med List Status: <None>   Medication Order Taking? Sig Documenting Provider Last Dose Status Informant  gabapentin  (NEURONTIN ) 300 MG capsule [id number]  Take 1 capsule (300 mg total) by mouth at bedtime as needed. Arvis Jolan NOVAK, PA-C  Active   hydrOXYzine  (ATARAX ) 25 MG tablet [id number] No Take 1 tablet (25 mg total) by mouth at bedtime as needed.  Patient not taking: Reported on 02/07/2024   Manya Toribio SQUIBB, PA Not Taking Active   meloxicam  (MOBIC ) 15 MG tablet [id number]  Take 1 tablet (15 mg total) by mouth daily. Arvis Jolan NOVAK, PA-C  Active   methocarbamol  (ROBAXIN ) 500 MG tablet [id number]  Take 1 tablet (500 mg total) by mouth every 8 (eight) hours as needed. Triplett, Cari B, FNP  Active   minoxidil  (ROGAINE ) 2 % external solution [id number] No Apply topically 2 (two) times daily. To scalp for hair loss. Manya Toribio SQUIBB, PA Taking Active   mupirocin   ointment (BACTROBAN ) 2 % [id number] No Apply 1 Application topically as needed. [provider] Taking Active   pantoprazole  (PROTONIX ) 40 MG tablet [id number] No Take 1 tablet (40 mg total) by mouth daily. Bernardino Ditch, NP Taking Active   predniSONE  (STERAPRED UNI-PAK 21 TAB) 10 MG (21) TBPK tablet [id number]  Take 6 tablets on the first day and decrease by 1 tablet each day until finished. Herlinda Kirk NOVAK, FNP  Active             Home Care and Equipment/Supplies: Were Home Health Services Ordered?: NA Any new equipment or medical supplies ordered?: NA  Functional Questionnaire: Do you need assistance with bathing/showering or dressing?: No Do you need assistance with meal preparation?: No Do you need assistance with eating?: No Do you have difficulty maintaining continence: No Do you need assistance with getting out of bed/getting out of a chair/moving?: No Do you have difficulty managing or taking your medications?: No  Follow up appointments reviewed: PCP Follow-up appointment confirmed?: Yes Date of PCP follow-up appointment?: 05/16/24 Follow-up Provider: Rolan Manya Specialist [facility] Follow-up appointment confirmed?: No Do you need transportation to your follow-up appointment?: No Do you understand care options if your condition(s) worsen?: Yes-patient verbalized understanding    SIGNATURE Steva Rubyann Lingle, CMA

## 2024-05-16 ENCOUNTER — Ambulatory Visit (INDEPENDENT_AMBULATORY_CARE_PROVIDER_SITE_OTHER): Payer: Self-pay | Admitting: Physician Assistant

## 2024-05-16 ENCOUNTER — Other Ambulatory Visit: Payer: Self-pay | Admitting: Physician Assistant

## 2024-05-16 ENCOUNTER — Encounter: Payer: Self-pay | Admitting: Physician Assistant

## 2024-05-16 VITALS — BP 108/78 | HR 66 | Temp 98.3°F | Ht 65.0 in | Wt 158.0 lb

## 2024-05-16 DIAGNOSIS — R739 Hyperglycemia, unspecified: Secondary | ICD-10-CM

## 2024-05-16 DIAGNOSIS — R202 Paresthesia of skin: Secondary | ICD-10-CM

## 2024-05-16 DIAGNOSIS — R350 Frequency of micturition: Secondary | ICD-10-CM

## 2024-05-16 DIAGNOSIS — E538 Deficiency of other specified B group vitamins: Secondary | ICD-10-CM

## 2024-05-16 DIAGNOSIS — N3000 Acute cystitis without hematuria: Secondary | ICD-10-CM

## 2024-05-16 DIAGNOSIS — Z5971 Insufficient health insurance coverage: Secondary | ICD-10-CM

## 2024-05-16 LAB — POCT URINALYSIS DIPSTICK
Bilirubin, UA: NEGATIVE
Blood, UA: NEGATIVE
Glucose, UA: NEGATIVE
Ketones, UA: NEGATIVE
Nitrite, UA: NEGATIVE
Protein, UA: NEGATIVE
Spec Grav, UA: 1.01 (ref 1.010–1.025)
Urobilinogen, UA: 0.2 U/dL
pH, UA: 7 (ref 5.0–8.0)

## 2024-05-16 MED ORDER — NITROFURANTOIN MONOHYD MACRO 100 MG PO CAPS
100.0000 mg | ORAL_CAPSULE | Freq: Two times a day (BID) | ORAL | 0 refills | Status: AC
Start: 2024-05-16 — End: 2024-05-21

## 2024-05-16 NOTE — Progress Notes (Signed)
 Date:  05/16/2024   Name:  Brittney Hampton   DOB:  1966-08-10   MRN:  [id number]   Chief Complaint: Hospitalization Follow-up (Still having, Abdominal pain,both leg pain) and Urinary Frequency (X1 week, Can't hold urine, unchanged )  HPI Henley returns today for follow-up after being seen at [facility] ED for left-sided back pain with radiation into the thigh.  She was given a prednisone  taper and Robaxin .  She has previously mentioned paresthesias of all 4 extremities with known history of B12 deficiency.  At prior visits with me, we have deferred lab work on account of her self-pay status (Medicaid family-planning only).  According to her social worker, she is excluded from full Medicaid status due to slightly exceeding the income criteria.  Because of her self-pay status, she has delayed consult with 5 specialists that I can count: GI, derm, rheum, ENT, ophtho.  She continues to complain of chronic abdominal pain and nausea.  She also mentions urinary frequency today.   Medication list has been reviewed and updated.  Current Meds  Medication Sig   gabapentin  (NEURONTIN ) 300 MG capsule Take 1 capsule (300 mg total) by mouth at bedtime as needed.   meloxicam  (MOBIC ) 15 MG tablet Take 1 tablet (15 mg total) by mouth daily.   methocarbamol  (ROBAXIN ) 500 MG tablet Take 1 tablet (500 mg total) by mouth every 8 (eight) hours as needed.   minoxidil  (ROGAINE ) 2 % external solution Apply topically 2 (two) times daily. To scalp for hair loss.   mupirocin  ointment (BACTROBAN ) 2 % Apply 1 Application topically as needed.   nitrofurantoin , macrocrystal-monohydrate, (MACROBID ) 100 MG capsule Take 1 capsule (100 mg total) by mouth 2 (two) times daily for 5 days.   pantoprazole  (PROTONIX ) 40 MG tablet Take 1 tablet (40 mg total) by mouth daily.     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   Chronic pain of both knees 06/07/2022   Lumbar spondylolysis 02/25/2022   Osteopenia determined by x-ray 01/27/2022   Nodule of lower lobe of right lung 01/26/2022   Vitamin D  deficiency 10/25/2021   Varicose veins of both lower extremities with pain 10/24/2021   GERD (gastroesophageal reflux disease) 10/24/2021   History of kidney stones 08/16/2021   Mild hyperlipidemia 08/16/2021   Insomnia 06/20/2021   Hepatic hemangioma 04/30/2021   Hepatic steatosis 04/30/2021   ANA positive 04/04/2021   B12 deficiency 04/02/2021   Joint pain 04/01/2021   IFG (impaired fasting glucose) 04/01/2021   Financial difficulties 04/01/2021   Aortic atherosclerosis (HCC) 03/31/2021    No Known Allergies  Immunization History  Administered Date(s) Administered   Influenza,inj,Quad PF,6+ Mos 06/20/2021   Influenza-Unspecified 05/27/2022   PFIZER(Purple Top)SARS-COV-2 Vaccination 04/08/2020, 05/10/2020, 12/28/2020   Td 05/27/2022   Tdap 05/27/2022   Zoster Recombinant(Shingrix) 05/27/2022    Past Surgical History:  Procedure Laterality Date   ABDOMINAL SURGERY     CESAREAN SECTION      Social History   Tobacco Use   Smoking status: Never   Smokeless tobacco: Never  Vaping Use   Vaping status: Never Used  Substance Use Topics   Alcohol  use: Never   Drug use: Never    Family History  Problem Relation Age of Onset   Osteoarthritis Mother    Diabetes Father    Cancer Sister    Cancer Brother         05/16/2024    2:04 PM 02/07/2024    2:26 PM 11/12/2023    1:47 PM 08/31/2023   10:48 AM  GAD 7 : Generalized Anxiety Score  Nervous, Anxious, on Edge 0 0 0 0  Control/stop worrying 0 0 0 0  Worry too much - different things 0 0 0 0  Trouble relaxing 0 0  0 0  Restless 0 0 0 0  Easily annoyed or irritable 0 0 0 0  Afraid - awful might happen  0 0 0  Total GAD 7 Score  0 0 0  Anxiety Difficulty Not difficult at all Not difficult at all Not difficult at all Not difficult at all       05/16/2024    2:04 PM 02/07/2024    2:26 PM 11/12/2023    1:46 PM  Depression screen PHQ 2/9  Decreased Interest 0 0 0  Down, Depressed, Hopeless 0 0 0  PHQ - 2 Score 0 0 0    BP Readings from Last 3 Encounters:  05/16/24 108/78  05/10/24 107/65  04/21/24 122/75    Wt Readings from Last 3 Encounters:  05/16/24 158 lb (71.7 kg)  05/10/24 160 lb (72.6 kg)  04/21/24 160 lb 9.6 oz (72.8 kg)    BP 108/78   Pulse 66   Temp 98.3 F (36.8 C)   Ht 5' 5 (1.651 m)   Wt 158 lb (71.7 kg)   LMP 12/06/2016 (Approximate)   SpO2 97%   BMI 26.29 kg/m   Physical Exam Vitals and nursing note reviewed.  Constitutional:      Appearance: Normal appearance.  Cardiovascular:     Rate and Rhythm: Normal rate.  Pulmonary:     Effort: Pulmonary effort is normal.  Abdominal:     General: There is no distension.     Tenderness: There is no right CVA tenderness or left CVA tenderness.  Musculoskeletal:        General: Normal range of motion.  Skin:    General: Skin is warm and dry.  Neurological:     Mental Status: She is alert and oriented to person, place, and time.     Gait: Gait is intact.  Psychiatric:        Mood and Affect: Mood and affect normal.     Recent Labs     Component Value Date/Time   NA 144 11/12/2023 1513   K 3.9 11/12/2023 1513   CL 106 11/12/2023 1513   CO2 24 11/12/2023 1513   GLUCOSE 80 11/12/2023 1513   GLUCOSE 127 (H) 12/03/2020 1312   BUN 15 11/12/2023 1513   CREATININE 0.67 11/12/2023 1513   CALCIUM 9.3 11/12/2023 1513   PROT 6.8 11/12/2023 1513   ALBUMIN 4.3 11/12/2023 1513   AST 19 11/12/2023 1513   ALT 13 11/12/2023 1513   ALKPHOS 106 11/12/2023 1513   BILITOT 0.6 11/12/2023 1513   GFRNONAA >60 12/03/2020 1312     Lab Results  Component Value Date   WBC 5.2 11/12/2023   HGB 14.5 11/12/2023   HCT 43.2 11/12/2023   MCV 92 11/12/2023   PLT 242 11/12/2023   Lab Results  Component Value Date   HGBA1C 5.5 01/25/2022   HGBA1C 5.3  08/16/2021   HGBA1C 5.6 04/01/2021   Lab Results  Component Value Date   CHOL 221 (H) 07/13/2021   HDL 66 07/13/2021   LDLCALC 137 (H) 07/13/2021   TRIG 103 07/13/2021   CHOLHDL 3.3 07/13/2021   Lab Results  Component Value Date   TSH 1.660 11/12/2023      Assessment and Plan:  1. Urinary frequency (Primary) Urine dipstick would suggest a mild UTI.  Will send for culture.  Also will screen for diabetes given the complaint of urinary frequency in the context of multiple mildly hyperglycemic readings on previous metabolic panels. - POCT Urinalysis Dipstick - Urine Culture  2. Acute cystitis without hematuria Treat with nitrofurantoin  as below.  Culture pending. - nitrofurantoin , macrocrystal-monohydrate, (MACROBID ) 100 MG capsule; Take 1 capsule (100 mg total) by mouth 2 (two) times daily for 5 days.  Dispense: 10 capsule; Refill: 0  3. Uninsured Patient given the number for the complex care management team, where her referral was sent in May but they were unable to reach her after 3 attempts.  Hopefully they will be able to help with financial resources and possibly a better insurance plan  4. Hyperglycemia - CBC with Differential/Platelet - Comprehensive metabolic panel with GFR - Hemoglobin A1c  5. B12 deficiency - B12 and Folate Panel  6. Paresthesia of upper and lower extremities of both sides First will assess for ongoing B vitamin deficiency.  It should also be noted that she has positive ANA and needs to see rheumatology.  For now, she may continue gabapentin  as needed. - B12 and Folate Panel - CBC with Differential/Platelet - Hemoglobin A1c    Follow-up to be determined   Rolan Hoyle, PA-C, DMSc, Nutritionist [facility]  and Sports Medicine [facility] [phone]

## 2024-05-17 LAB — COMPREHENSIVE METABOLIC PANEL WITH GFR
ALT: 20 IU/L (ref 0–32)
AST: 14 IU/L (ref 0–40)
Albumin: 4 g/dL (ref 3.8–4.9)
Alkaline Phosphatase: 87 IU/L (ref 44–121)
BUN/Creatinine Ratio: 16 (ref 9–23)
BUN: 12 mg/dL (ref 6–24)
Bilirubin Total: 0.4 mg/dL (ref 0.0–1.2)
CO2: 25 mmol/L (ref 20–29)
Calcium: 9.2 mg/dL (ref 8.7–10.2)
Chloride: 103 mmol/L (ref 96–106)
Creatinine, Ser: 0.73 mg/dL (ref 0.57–1.00)
Globulin, Total: 2.4 g/dL (ref 1.5–4.5)
Glucose: 76 mg/dL (ref 70–99)
Potassium: 4.4 mmol/L (ref 3.5–5.2)
Sodium: 141 mmol/L (ref 134–144)
Total Protein: 6.4 g/dL (ref 6.0–8.5)
eGFR: 96 mL/min/1.73 (ref >59)

## 2024-05-17 LAB — CBC WITH DIFFERENTIAL/PLATELET
Basophils Absolute: 0.1 x10E3/uL (ref 0.0–0.2)
Basos: 1 %
EOS (ABSOLUTE): 0.2 x10E3/uL (ref 0.0–0.4)
Eos: 3 %
Hematocrit: 45 % (ref 34.0–46.6)
Hemoglobin: 14.9 g/dL (ref 11.1–15.9)
Immature Grans (Abs): 0.1 x10E3/uL (ref 0.0–0.1)
Immature Granulocytes: 1 %
Lymphocytes Absolute: 2.2 x10E3/uL (ref 0.7–3.1)
Lymphs: 31 %
MCH: 31 pg (ref 26.6–33.0)
MCHC: 33.1 g/dL (ref 31.5–35.7)
MCV: 94 fL (ref 79–97)
Monocytes Absolute: 0.5 x10E3/uL (ref 0.1–0.9)
Monocytes: 7 %
Neutrophils Absolute: 4.1 x10E3/uL (ref 1.4–7.0)
Neutrophils: 57 %
Platelets: 257 x10E3/uL (ref 150–450)
RBC: 4.8 x10E6/uL (ref 3.77–5.28)
RDW: 12.6 % (ref 11.7–15.4)
WBC: 7 x10E3/uL (ref 3.4–10.8)

## 2024-05-17 LAB — HEMOGLOBIN A1C
Est. average glucose Bld gHb Est-mCnc: 114 mg/dL
Hgb A1c MFr Bld: 5.6 % (ref 4.8–5.6)

## 2024-05-17 LAB — B12 AND FOLATE PANEL
Folate: 13.2 ng/mL (ref >3.0)
Vitamin B-12: 313 pg/mL (ref 232–1245)

## 2024-05-18 LAB — URINE CULTURE

## 2024-05-19 ENCOUNTER — Ambulatory Visit: Payer: Self-pay | Admitting: Physician Assistant

## 2024-05-23 NOTE — Progress Notes (Signed)
 Spoke with patient and informed her of results. She verbalized understanding.  JM

## 2024-08-01 ENCOUNTER — Ambulatory Visit
Admission: RE | Admit: 2024-08-01 | Discharge: 2024-08-01 | Disposition: A | Discharge status: Home / Self Care | Payer: Self-pay | Attending: Physician Assistant | Admitting: Physician Assistant

## 2024-08-01 ENCOUNTER — Ambulatory Visit (INDEPENDENT_AMBULATORY_CARE_PROVIDER_SITE_OTHER): Payer: Self-pay | Admitting: Physician Assistant

## 2024-08-01 ENCOUNTER — Ambulatory Visit: Payer: Self-pay

## 2024-08-01 ENCOUNTER — Encounter: Payer: Self-pay | Admitting: Physician Assistant

## 2024-08-01 ENCOUNTER — Ambulatory Visit
Admission: RE | Admit: 2024-08-01 | Discharge: 2024-08-01 | Disposition: A | Discharge status: Home / Self Care | Payer: Self-pay | Source: Ambulatory Visit | Attending: Physician Assistant | Admitting: Physician Assistant

## 2024-08-01 VITALS — BP 130/76 | HR 61 | Temp 97.8°F | Ht 65.0 in | Wt 158.0 lb

## 2024-08-01 DIAGNOSIS — R10A1 Flank pain, right side: Secondary | ICD-10-CM

## 2024-08-01 DIAGNOSIS — M25512 Pain in left shoulder: Secondary | ICD-10-CM

## 2024-08-01 MED ORDER — MELOXICAM 15 MG PO TABS: 15.0000 mg | ORAL_TABLET | Freq: Every day | ORAL | 0 refills | Status: DC

## 2024-08-01 NOTE — Progress Notes (Signed)
 Date:  08/01/2024   Name:  Brittney Hampton   DOB:  10-03-1965   MRN:  [id number]   Chief Complaint: Back Pain (Right upper back, aching  pain that lingers, has tired Advil for it, has been going on for 2 weeks)  HPI  Brittney Hampton presents today for evaluation of intermittent right lower posterior rib pain. She calls it lung pain even though it does not seem to be pleuritic in nature. The pain is completely random and is not reproducible with movement. She states that when she was 58 years old, they found a spot on her xray, then repeated the xray years later without any interval growth, but it has not been imaged since.  Also lifted a ladder about 3 weeks ago and felt a pop in the left shoulder, with pain the next day and ongoing since then.   Household income too high to qualify for Medicaid, insurance benefit through work is pending   Medication list has been reviewed and updated.  Current Meds  Medication Sig   gabapentin  (NEURONTIN ) 300 MG capsule Take 1 capsule (300 mg total) by mouth at bedtime as needed.   methocarbamol  (ROBAXIN ) 500 MG tablet Take 1 tablet (500 mg total) by mouth every 8 (eight) hours as needed.   minoxidil  (ROGAINE ) 2 % external solution Apply topically 2 (two) times daily. To scalp for hair loss.   mupirocin  ointment (BACTROBAN ) 2 % Apply 1 Application topically as needed.   pantoprazole  (PROTONIX ) 40 MG tablet Take 1 tablet (40 mg total) by mouth daily. (Patient taking differently: Take 40 mg by mouth as needed.)   [DISCONTINUED] meloxicam  (MOBIC ) 15 MG tablet Take 1 tablet (15 mg total) by mouth daily. 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   Chronic pain of both knees 06/07/2022   Lumbar spondylolysis 02/25/2022   Osteopenia determined by x-ray 01/27/2022   Nodule of lower lobe of right lung 01/26/2022   Vitamin D  deficiency 10/25/2021   Varicose veins of both lower extremities with pain 10/24/2021   GERD (gastroesophageal reflux disease) 10/24/2021   History of kidney stones 08/16/2021   Mild hyperlipidemia 08/16/2021   Insomnia 06/20/2021   Hepatic hemangioma 04/30/2021   Hepatic steatosis 04/30/2021   ANA positive 04/04/2021   B12 deficiency 04/02/2021   Joint pain 04/01/2021   IFG (impaired fasting glucose) 04/01/2021   Financial difficulties 04/01/2021   Aortic atherosclerosis 03/31/2021    No Known Allergies  Immunization History  Administered Date(s) Administered   Influenza,inj,Quad PF,6+ Mos 06/20/2021   Influenza-Unspecified 05/27/2022   PFIZER(Purple Top)SARS-COV-2 Vaccination 04/08/2020, 05/10/2020, 12/28/2020   Td 05/27/2022   Tdap 05/27/2022   Zoster Recombinant(Shingrix) 05/27/2022    Past Surgical History:  Procedure Laterality Date   ABDOMINAL SURGERY     CESAREAN SECTION      Social History   Tobacco Use   Smoking status: Never   Smokeless tobacco: Never  Vaping Use   Vaping status: Never Used  Substance Use Topics   Alcohol use: Never   Drug use: Never  Family History  Problem Relation Age of Onset   Osteoarthritis Mother    Diabetes Father    Cancer Sister    Cancer Brother         05/16/2024    2:04 PM 02/07/2024    2:26 PM 11/12/2023    1:47 PM 08/31/2023   10:48 AM  GAD 7 : Generalized Anxiety Score  Nervous, Anxious, on Edge 0 0 0 0  Control/stop worrying 0 0 0 0  Worry too much - different things 0 0 0 0  Trouble relaxing 0 0 0 0  Restless 0 0 0 0  Easily annoyed or irritable 0 0 0 0  Afraid - awful might happen  0 0 0  Total GAD 7 Score  0 0 0  Anxiety Difficulty  Not difficult at all Not difficult at all Not difficult at all Not difficult at all       05/16/2024    2:04 PM 02/07/2024    2:26 PM 11/12/2023    1:46 PM  Depression screen PHQ 2/9  Decreased Interest 0 0 0  Down, Depressed, Hopeless 0 0 0  PHQ - 2 Score 0 0 0    BP Readings from Last 3 Encounters:  08/01/24 130/76  05/16/24 108/78  05/10/24 107/65    Wt Readings from Last 3 Encounters:  08/01/24 158 lb (71.7 kg)  05/16/24 158 lb (71.7 kg)  05/10/24 160 lb (72.6 kg)    BP 130/76   Pulse 61   Temp 97.8 F (36.6 C) (Oral)   Ht 5' 5 (1.651 m)   Wt 158 lb (71.7 kg)   LMP 12/06/2016 (Approximate)   SpO2 98%   BMI 26.29 kg/m   Physical Exam Vitals and nursing note reviewed.  Constitutional:      Appearance: Normal appearance.  Cardiovascular:     Rate and Rhythm: Normal rate.  Pulmonary:     Effort: Pulmonary effort is normal.     Breath sounds: Normal breath sounds. No wheezing, rhonchi or rales.  Abdominal:     General: There is no distension.     Tenderness: There is no right CVA tenderness.  Musculoskeletal:        General: Normal range of motion.     Comments: Full AROM of lumbar and thoracic spine without pain. Pain with internal rotation of left shoulder. Neg empty can.   Skin:    General: Skin is warm and dry.  Neurological:     Mental Status: She is alert and oriented to person, place, and time.     Gait: Gait is intact.  Psychiatric:        Mood and Affect: Mood and affect normal.     Recent Labs     Component Value Date/Time   NA 141 05/16/2024 1522   K 4.4 05/16/2024 1522   CL 103 05/16/2024 1522   CO2 25 05/16/2024 1522   GLUCOSE 76 05/16/2024 1522   GLUCOSE 127 (H) 12/03/2020 1312   BUN 12 05/16/2024 1522   CREATININE 0.73 05/16/2024 1522   CALCIUM 9.2 05/16/2024 1522   PROT 6.4 05/16/2024 1522   ALBUMIN 4.0 05/16/2024 1522   AST 14 05/16/2024 1522   ALT 20 05/16/2024 1522   ALKPHOS 87 05/16/2024 1522   BILITOT 0.4 05/16/2024 1522    GFRNONAA >60 12/03/2020 1312    Lab Results  Component Value Date   WBC 7.0 05/16/2024   HGB 14.9 05/16/2024   HCT 45.0 05/16/2024   MCV  94 05/16/2024   PLT 257 05/16/2024   Lab Results  Component Value Date   HGBA1C 5.6 05/16/2024   HGBA1C 5.5 01/25/2022   HGBA1C 5.3 08/16/2021   Lab Results  Component Value Date   CHOL 221 (H) 07/13/2021   HDL 66 07/13/2021   LDLCALC 137 (H) 07/13/2021   TRIG 103 07/13/2021   CHOLHDL 3.3 07/13/2021   Lab Results  Component Value Date   TSH 1.660 11/12/2023      Assessment and Plan:  1. Acute right flank pain (Primary) Pain seems to be mysterious and random. CXR today normal by my initial interpretation. May seek renal u/s but would be cost-prohibitive at this time. Meloxicam  may help. F/u TBD based on response.   - DG Chest 2 View; Future  2. Acute pain of left shoulder Advised to take meloxicam  for her shoulder pain once daily for at least 2 weeks straight.  May also use topical diclofenac  up to 4 times daily as desired. - meloxicam  (MOBIC ) 15 MG tablet; Take 1 tablet (15 mg total) by mouth daily with breakfast.  Dispense: 30 tablet; Refill: 0     Rolan Hoyle, PA-C, DMSc, Nutritionist [facility] Primary Care and Sports Medicine [facility] [phone]

## 2024-08-01 NOTE — Telephone Encounter (Signed)
 FYI Only or Action Required?: FYI only for provider: appointment scheduled on 08/01/2024.  Patient was last seen in primary care on 05/16/2024 by Manya Toribio SQUIBB, PA.  Called Nurse Triage reporting Back Pain.  Symptoms began several weeks ago.  Interventions attempted: OTC medications: Advil.  Symptoms are: gradually worsening.  Triage Disposition: See Physician Within 24 Hours (overriding See PCP When Office is Open (Within 3 Days))  Patient/caregiver understands and will follow disposition?: Yes       Copied from CRM #[id number]. Topic: Clinical - Red Word Triage >> Aug 01, 2024  9:21 AM Leonette SQUIBB wrote: Red Word that prompted transfer to Nurse Triage: pain in left shoulder arm and back,  dizziness Reason for Disposition  [1] MODERATE back pain (e.g., interferes with normal activities) AND [2] present > 3 days  Answer Assessment - Initial Assessment Questions 1. ONSET: When did the pain begin? (e.g., minutes, hours, days)     2 weeks  2. LOCATION: Where does it hurt? (upper, mid or lower back)     Upper back and side area of back  3. SEVERITY: How bad is the pain?  (e.g., Scale 1-10; mild, moderate, or severe)     Moderate but worsens at times  4. PATTERN: Is the pain constant? (e.g., yes, no; constant, intermittent)      Comes and goes  5. RADIATION: Does the pain shoot into your legs or somewhere else?     Goes to her shoulder and arm area  6. CAUSE:  What do you think is causing the back pain?      Repetitive motions at work  7. BACK OVERUSE:  Any recent lifting of heavy objects, strenuous work or exercise?     Denies  8. MEDICINES: What have you taken so far for the pain? (e.g., nothing, acetaminophen , NSAIDS)     Advil  9. NEUROLOGIC SYMPTOMS: Do you have any weakness, numbness, or problems with bowel/bladder control?     Denies  10. OTHER SYMPTOMS: Do you have any other symptoms? (e.g., fever, abdomen pain, burning with urination, blood in  urine)       Dizziness, headache  Protocols used: Back Pain-A-AH

## 2024-08-01 NOTE — Telephone Encounter (Signed)
 Noted  Pt has appt.  KP

## 2024-08-01 NOTE — Patient Instructions (Signed)
 Call [phone]  Apply on their website , [email or web address]

## 2024-08-11 ENCOUNTER — Ambulatory Visit: Payer: Self-pay | Admitting: Physician Assistant

## 2024-08-15 ENCOUNTER — Ambulatory Visit (INDEPENDENT_AMBULATORY_CARE_PROVIDER_SITE_OTHER): Payer: Self-pay | Admitting: Physician Assistant

## 2024-08-15 ENCOUNTER — Encounter: Payer: Self-pay | Admitting: Physician Assistant

## 2024-08-15 ENCOUNTER — Ambulatory Visit: Payer: Self-pay

## 2024-08-15 VITALS — BP 128/72 | HR 62 | Ht 65.0 in | Wt 158.0 lb

## 2024-08-15 DIAGNOSIS — M25512 Pain in left shoulder: Secondary | ICD-10-CM

## 2024-08-15 MED ORDER — KETOROLAC TROMETHAMINE 30 MG/ML IJ SOLN
30.0000 mg | Freq: Once | INTRAMUSCULAR | Status: AC
  Administered 2024-08-15: 30 mg via INTRAMUSCULAR

## 2024-08-15 MED ORDER — DICLOFENAC SODIUM 75 MG PO TBEC: 75.0000 mg | DELAYED_RELEASE_TABLET | Freq: Two times a day (BID) | ORAL | 0 refills | Status: AC

## 2024-08-15 NOTE — Telephone Encounter (Signed)
 Noted  Please review.  KP

## 2024-08-15 NOTE — Progress Notes (Signed)
 Date:  08/15/2024   Name:  Brittney Hampton   DOB:  09-11-1966   MRN:  [id number]   Chief Complaint: Shoulder Pain (Left shoulder , can't sleep )  HPI  Brittney Hampton returns to clinic today with worsening left shoulder pain. No acute injury. Works in [employer]. Pain is worse when her hand is down by her side. Pain kept her from sleeping last night. Associated paresthesias in the fingers of left hand.   Brittney Hampton 08/15/24: Patient called in to report pain worsens with movement in Left shoulder. This has been ongoing x 2 months, however last night, she was unable to sleep due to the pain. She says she applying cream for the muscle pain. Methocarbamol  is not providng relief either. Appointment scheduled for evaluation. Patient agrees with plan of care, and will call back if anything changes, or if symptoms worsen.  Medication list has been reviewed and updated.  Current Meds  Medication Sig   diclofenac  (VOLTAREN ) 75 MG EC tablet Take 1 tablet (75 mg total) by mouth 2 (two) times daily. Take with food. Do not take with any other oral NSAIDs (ibuprofen, naproxen , meloxicam , aspirin, etc).   gabapentin  (NEURONTIN ) 300 MG capsule Take 1 capsule (300 mg total) by mouth at bedtime as needed.   methocarbamol  (ROBAXIN ) 500 MG tablet Take 1 tablet (500 mg total) by mouth every 8 (eight) hours as needed.   minoxidil  (ROGAINE ) 2 % external solution Apply topically 2 (two) times daily. To scalp for hair loss.   mupirocin  ointment (BACTROBAN ) 2 % Apply 1 Application topically as needed.   pantoprazole  (PROTONIX ) 40 MG tablet Take 1 tablet (40 mg total) by mouth daily.   [DISCONTINUED] meloxicam  (MOBIC ) 15 MG tablet Take 1 tablet (15 mg total) by mouth daily with breakfast.     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   Chronic pain of both knees 06/07/2022   Lumbar spondylolysis 02/25/2022   Osteopenia determined by x-ray 01/27/2022   Nodule of lower lobe of right lung 01/26/2022   Vitamin D  deficiency 10/25/2021   Varicose veins of both lower extremities with pain 10/24/2021   GERD (gastroesophageal reflux disease) 10/24/2021   History of kidney stones 08/16/2021   Mild hyperlipidemia 08/16/2021   Insomnia 06/20/2021   Hepatic hemangioma 04/30/2021   Hepatic steatosis 04/30/2021   ANA positive 04/04/2021   B12 deficiency 04/02/2021   Joint pain 04/01/2021   IFG (impaired fasting glucose) 04/01/2021   Financial difficulties 04/01/2021   Aortic atherosclerosis 03/31/2021    No Known Allergies  Immunization History  Administered Date(s) Administered   Influenza,inj,Quad PF,6+ Mos 06/20/2021   Influenza-Unspecified 05/27/2022   PFIZER(Purple Top)SARS-COV-2 Vaccination 04/08/2020, 05/10/2020, 12/28/2020   Td 05/27/2022   Tdap 05/27/2022   Zoster Recombinant(Shingrix) 05/27/2022    Past Surgical History:  Procedure Laterality Date   ABDOMINAL SURGERY     CESAREAN SECTION      Social History   Tobacco Use   Smoking status: Never   Smokeless tobacco: Never  Vaping Use   Vaping status: Never Used  Substance Use  Topics   Alcohol use: Never   Drug use: Never    Family History  Problem Relation Age of Onset   Osteoarthritis Mother    Diabetes Father    Cancer Sister    Cancer Brother         08/15/2024    3:55 PM 05/16/2024    2:04 PM 02/07/2024    2:26 PM 11/12/2023    1:47 PM  GAD 7 : Generalized Anxiety Score  Nervous, Anxious, on Edge 0 0 0 0  Control/stop worrying 0 0 0 0  Worry too much - different things 0 0 0 0  Trouble relaxing 0 0 0 0  Restless 0 0 0 0  Easily  annoyed or irritable 0 0 0 0  Afraid - awful might happen 0  0 0  Total GAD 7 Score 0  0 0  Anxiety Difficulty Not difficult at all Not difficult at all Not difficult at all Not difficult at all       08/15/2024    3:55 PM 05/16/2024    2:04 PM 02/07/2024    2:26 PM  Depression screen PHQ 2/9  Decreased Interest 0 0 0  Down, Depressed, Hopeless 0 0 0  PHQ - 2 Score 0 0 0    BP Readings from Last 3 Encounters:  08/15/24 128/72  08/01/24 130/76  05/16/24 108/78    Wt Readings from Last 3 Encounters:  08/15/24 158 lb (71.7 kg)  08/01/24 158 lb (71.7 kg)  05/16/24 158 lb (71.7 kg)    BP 128/72   Pulse 62   Ht 5' 5 (1.651 m)   Wt 158 lb (71.7 kg)   LMP 12/06/2016 (Approximate)   SpO2 98%   BMI 26.29 kg/m   Physical Exam Vitals and nursing Hampton reviewed.  Constitutional:      Appearance: Normal appearance.  Cardiovascular:     Rate and Rhythm: Normal rate.  Pulmonary:     Effort: Pulmonary effort is normal.  Abdominal:     General: There is no distension.  Musculoskeletal:        General: Normal range of motion.     Comments: Left shoulder is warm to the touch but without erythema. TTP at the lateral aspect of the deltoid. ROM somewhat limited due to pain in all directions but especially passive internal rotation and with resisted external rotation. Negative Neers. Positive empty can.   Skin:    General: Skin is warm and dry.  Neurological:     Mental Status: She is alert and oriented to person, place, and time.     Gait: Gait is intact.  Psychiatric:        Mood and Affect: Mood and affect normal.     Recent Labs     Component Value Date/Time   NA 141 05/16/2024 1522   K 4.4 05/16/2024 1522   CL 103 05/16/2024 1522   CO2 25 05/16/2024 1522   GLUCOSE 76 05/16/2024 1522   GLUCOSE 127 (H) 12/03/2020 1312   BUN 12 05/16/2024 1522   CREATININE 0.73 05/16/2024 1522   CALCIUM 9.2 05/16/2024 1522   PROT 6.4 05/16/2024 1522   ALBUMIN 4.0 05/16/2024 1522   AST  14 05/16/2024 1522   ALT 20 05/16/2024 1522   ALKPHOS 87 05/16/2024 1522   BILITOT 0.4 05/16/2024 1522   GFRNONAA >60 12/03/2020 1312    Lab Results  Component Value Date   WBC 7.0 05/16/2024   HGB 14.9 05/16/2024   HCT  45.0 05/16/2024   MCV 94 05/16/2024   PLT 257 05/16/2024   Lab Results  Component Value Date   HGBA1C 5.6 05/16/2024   HGBA1C 5.5 01/25/2022   HGBA1C 5.3 08/16/2021   Lab Results  Component Value Date   CHOL 221 (H) 07/13/2021   HDL 66 07/13/2021   LDLCALC 137 (H) 07/13/2021   TRIG 103 07/13/2021   CHOLHDL 3.3 07/13/2021   Lab Results  Component Value Date   TSH 1.660 11/12/2023      Assessment and Plan:  1. Subacute pain of left shoulder (Primary) Consulted today with our sports medicine physician Dr. Selinda Ku who conducted brief exam.  Likely rotator cuff pathology from overuse.  Toradol  administered IM today, stop meloxicam , begin oral diclofenac  tomorrow.  May continue with diclofenac  gel.  Patient was given some exercises for home rehab of the left shoulder.  Encouraged to seek light duty at work, we will write her a Hampton if needed.  I will check in with her on Monday to see if she has had any improvement.  If not, plan for course of prednisone  instead of NSAID.  If no improvement in the next 2 to 4 weeks, consider corticosteroid shoulder injection.  She will need an x-ray prior to this.  - diclofenac  (VOLTAREN ) 75 MG EC tablet; Take 1 tablet (75 mg total) by mouth 2 (two) times daily. Take with food. Do not take with any other oral NSAIDs (ibuprofen, naproxen , meloxicam , aspirin, etc).  Dispense: 60 tablet; Refill: 0    Follow-up to be determined   Rolan Hoyle, PA-C, DMSc, Nutritionist [facility] Primary Care and Sports Medicine [facility] [phone]

## 2024-08-15 NOTE — Telephone Encounter (Signed)
 FYI Only or Action Required?: FYI only for provider: appointment scheduled on 12/5.  Patient was last seen in primary care on 08/01/2024 by Manya Toribio SQUIBB, PA.  Called Nurse Triage reporting Shoulder Pain.  Symptoms began yesterday.  Interventions attempted: Prescription medications: Methocarbamol .  Symptoms are: gradually worsening.  Triage Disposition: See Physician Within 24 Hours  Patient/caregiver understands and will follow disposition?: Yes        Copied from CRM #[id number]. Topic: Clinical - Red Word Triage >> Aug 15, 2024  9:40 AM Shanda MATSU wrote: Red Word that prompted transfer to Nurse Triage: Patient is reporting that she could not sleep due last night due to left shoulder pain. Reason for Disposition  Numbness (i.e., loss of sensation) in hand or fingers  Answer Assessment - Initial Assessment Questions 1. ONSET: When did the pain start?     X 2 months ago, pain worsened over night  2. LOCATION: Where is the pain located?     Left shoulder  3. PAIN: How bad is the pain? (Scale 1-10; or mild, moderate, severe)     Moderate   4. WORK OR EXERCISE: Has there been any recent work or exercise that involved this part of the body?     No   5. CAUSE: What do you think is causing the shoulder pain?     Unsure   6. OTHER SYMPTOMS: Do you have any other symptoms? (e.g., neck pain, swelling, rash, fever, numbness, weakness)  Neck muscle tightness, intermittent numbness in hands when pain is severe.     Patient called in to report pain worsens with movement in Left shoulder. This has been ongoing x 2 months, however last night, she was unable to sleep due to the pain. She says she applying cream for the muscle pain. Methocarbamol  is not providng relief either. Appointment scheduled for evaluation. Patient agrees with plan of care, and will call back if anything changes, or if symptoms worsen.  Protocols used: Shoulder Pain-A-AH

## 2024-08-18 ENCOUNTER — Encounter: Payer: Self-pay | Admitting: Physician Assistant

## 2024-08-29 ENCOUNTER — Telehealth: Payer: Self-pay

## 2024-08-29 ENCOUNTER — Encounter: Payer: Self-pay | Admitting: Family Medicine

## 2024-08-29 ENCOUNTER — Ambulatory Visit (INDEPENDENT_AMBULATORY_CARE_PROVIDER_SITE_OTHER): Payer: Self-pay | Admitting: Family Medicine

## 2024-08-29 VITALS — BP 108/68 | HR 64 | Ht 65.0 in

## 2024-08-29 DIAGNOSIS — M25512 Pain in left shoulder: Secondary | ICD-10-CM

## 2024-08-29 DIAGNOSIS — M7582 Other shoulder lesions, left shoulder: Secondary | ICD-10-CM

## 2024-08-29 MED ORDER — METHOCARBAMOL 500 MG PO TABS: 500.0000 mg | ORAL_TABLET | Freq: Three times a day (TID) | ORAL | 0 refills | Status: DC | PRN

## 2024-08-29 MED ORDER — DICLOFENAC SODIUM 75 MG PO TBEC: 75.0000 mg | DELAYED_RELEASE_TABLET | Freq: Two times a day (BID) | ORAL | 0 refills | Status: DC

## 2024-08-29 NOTE — Patient Instructions (Signed)
 VISIT SUMMARY:  You visited us  today to evaluate your persistent left shoulder pain and to assess your readiness to return to work. Your shoulder pain started after a work-related injury in October 2025 and has been improving with conservative management.  YOUR PLAN:  LEFT ROTATOR CUFF TENDINITIS: You have subacute left rotator cuff tendinitis, which is improving but still causes mild pain with certain movements and lifting over 20-30 pounds. -Continue taking diclofenac  twice daily as needed for pain and inflammation. You can taper the dose as your symptoms improve. -Continue taking methocarbamol  as needed for muscle soreness, preferably in the evening. Avoid using it at work. -Continue taking pantoprazole  to protect your stomach while you are on diclofenac . -Continue your prescribed shoulder exercises. Transition to the maintenance phase once your pain resolves. -You can return to work with a restriction of no lifting over thirty pounds for one month. After one month, you may return to full duty without restrictions. -We have scheduled a follow-up appointment in four weeks to reassess your shoulder function and readiness for full duty. -Contact the clinic if your pain worsens or if you are unable to perform your work duties due to shoulder pain. -We have provided instructions and documentation in both English and Spanish for your employer and personal reference.

## 2024-08-29 NOTE — Telephone Encounter (Signed)
 Follow up on last appt says to be determined.  KP

## 2024-08-29 NOTE — Telephone Encounter (Unsigned)
 Copied from CRM #[id number]. Topic: Clinical - Medical Advice >> Aug 29, 2024  9:03 AM Tiffany B wrote: Reason for CRM: Patient experiencing left shoulder pain and symptoms have worsened. Patient states her work is asking when she can return to work and patient is currently on light duty and work wants to know when she will return back to full duty.

## 2024-09-03 DIAGNOSIS — M7582 Other shoulder lesions, left shoulder: Secondary | ICD-10-CM | POA: Insufficient documentation

## 2024-09-03 NOTE — Progress Notes (Signed)
 "    Primary Care / Sports Medicine Office Visit  Patient Information:  Patient ID: Brittney Hampton, female DOB: Aug 19, 1966 Age: 58 y.o. MRN: [id number]   Brittney Hampton is a pleasant 58 y.o. female presenting with the following:  Chief Complaint  Patient presents with   Shoulder Pain    Patient presents today because her boss wants to know when she would be able to come back to work full time without restrictions. Patient states her left shoulder is getting better but when she does repetitive movement her pain comes back. Pain also occurs when she has to lift 30-40 lbs.     Vitals:   08/29/24 1513  BP: 108/68  Pulse: 64  SpO2: 97%   Vitals:   08/29/24 1513  Height: 5' 5 (1.651 m)   Body mass index is 26.29 kg/m.  No results found.   Discussed the use of AI scribe software for clinical note transcription with the patient, who gave verbal consent to proceed.   Independent interpretation of notes and tests performed by another provider:   None  Procedures performed:   None  Pertinent History, Exam, Impression, and Recommendations:   Problem List Items Addressed This Visit     Tendinitis of left rotator cuff - Primary   History of Present Illness Brittney Hampton is a 58 year old female with left rotator cuff tendinitis who presents for evaluation of persistent left shoulder pain and assessment of readiness to return to work.  Left shoulder pain and functional limitations - Onset in October 2025 after a work-related injury while preventing a box from falling, with immediate discomfort and audible crack - Initial symptoms were mild, progressing over two weeks to severe pain interfering with sleep and limiting shoulder mobility - Currently able to lift 10-20 pounds without significant pain - Lifting 30-40 pounds or performing repetitive tasks provokes pain and loss of strength in the left shoulder - Able to reach overhead and perform most activities of daily  living with only mild soreness - Reaching behind the back and repetitive lifting of heavier items exacerbates symptoms - No numbness, tingling, or pain radiating down the arm - No preceding change in activity or injury at home prior to the October incident - No prior history of shoulder problems in the past ten years  Occupational impact and psychosocial concerns - Missed two days of work recently due to shoulder pain - Concerned about ability to return to full duty without restrictions due to ongoing pain with heavier lifting - Expresses anxiety regarding potential job loss and financial strain [employer] has requested medical documentation regarding work status and any necessary restrictions  Associated symptoms - No fatigue, malaise, or other systemic symptoms  Physical Exam PALPATION: Localized discomfort at the superior aspect of the left shoulder. Minimal tenderness at the left biceps tendon, non-tender to subacromial. Non-tender left upper trapezius. RANGE OF MOTION: Full symmetric forward flexion and extension, abduction is full and symmetric. Internal rotation and extension is symmetric and painless. STRENGTH: External rotation, 5/5 strength bilaterally, left shoulder pain that recreates symptoms. Internal rotation, 5/5 strength, painless. Supraspinatus testing benign bilaterally, 5/5 strength. SPECIAL TESTS: Negative Neer's test. Negative Hawkins test. Negative Spurling's test bilaterally, rightward cervical torsion elicits left lateral cervical dysfunction.  Assessment and Plan Left rotator cuff tendinitis Subacute and improving due to work-related repetitive strain and an incident in October. Good strength, full range of motion, no rotator cuff tear or acute injury. Mild pain persists with certain  movements and lifting over 20-30 pounds. Expected to improve with conservative management. - Continued diclofenac  twice daily as needed for pain and inflammation, taper as symptoms  improve. - Continued methocarbamol  as needed for muscle soreness, preferably in the evening; avoid use at work. - Continued pantoprazole  for gastric protection while on diclofenac . - Continued prescribed shoulder exercises; transition to maintenance phase once pain resolves. - Provided work note allowing return to work with restriction of no lifting over thirty pounds for one month; after one month, may return to full duty without restrictions. - Scheduled follow-up in four weeks to reassess shoulder function and readiness for full duty. - Instructed to contact clinic if pain worsens or if unable to perform work duties due to shoulder pain. - Provided instructions and documentation in English and Spanish for employer and personal reference.      Relevant Medications   diclofenac  (VOLTAREN ) 75 MG EC tablet   methocarbamol  (ROBAXIN ) 500 MG tablet     Orders & Medications Medications:  Meds ordered this encounter  Medications   diclofenac  (VOLTAREN ) 75 MG EC tablet    Sig: Take 1 tablet (75 mg total) by mouth 2 (two) times daily.    Dispense:  60 tablet    Refill:  0   methocarbamol  (ROBAXIN ) 500 MG tablet    Sig: Take 1 tablet (500 mg total) by mouth every 8 (eight) hours as needed.    Dispense:  30 tablet    Refill:  0   No orders of the defined types were placed in this encounter.    No follow-ups on file.     Selinda JINNY Ku, MD, [facility]   Primary Care Sports Medicine Primary Care and Sports Medicine at [facility]   "

## 2024-09-03 NOTE — Assessment & Plan Note (Signed)
 History of Present Illness Ladaija Dimino is a 58 year old female with left rotator cuff tendinitis who presents for evaluation of persistent left shoulder pain and assessment of readiness to return to work.  Left shoulder pain and functional limitations - Onset in October 2025 after a work-related injury while preventing a box from falling, with immediate discomfort and audible crack - Initial symptoms were mild, progressing over two weeks to severe pain interfering with sleep and limiting shoulder mobility - Currently able to lift 10-20 pounds without significant pain - Lifting 30-40 pounds or performing repetitive tasks provokes pain and loss of strength in the left shoulder - Able to reach overhead and perform most activities of daily living with only mild soreness - Reaching behind the back and repetitive lifting of heavier items exacerbates symptoms - No numbness, tingling, or pain radiating down the arm - No preceding change in activity or injury at home prior to the October incident - No prior history of shoulder problems in the past ten years  Occupational impact and psychosocial concerns - Missed two days of work recently due to shoulder pain - Concerned about ability to return to full duty without restrictions due to ongoing pain with heavier lifting - Expresses anxiety regarding potential job loss and financial strain [employer] has requested medical documentation regarding work status and any necessary restrictions  Associated symptoms - No fatigue, malaise, or other systemic symptoms  Physical Exam PALPATION: Localized discomfort at the superior aspect of the left shoulder. Minimal tenderness at the left biceps tendon, non-tender to subacromial. Non-tender left upper trapezius. RANGE OF MOTION: Full symmetric forward flexion and extension, abduction is full and symmetric. Internal rotation and extension is symmetric and painless. STRENGTH: External rotation, 5/5 strength  bilaterally, left shoulder pain that recreates symptoms. Internal rotation, 5/5 strength, painless. Supraspinatus testing benign bilaterally, 5/5 strength. SPECIAL TESTS: Negative Neer's test. Negative Hawkins test. Negative Spurling's test bilaterally, rightward cervical torsion elicits left lateral cervical dysfunction.  Assessment and Plan Left rotator cuff tendinitis Subacute and improving due to work-related repetitive strain and an incident in October. Good strength, full range of motion, no rotator cuff tear or acute injury. Mild pain persists with certain movements and lifting over 20-30 pounds. Expected to improve with conservative management. - Continued diclofenac  twice daily as needed for pain and inflammation, taper as symptoms improve. - Continued methocarbamol  as needed for muscle soreness, preferably in the evening; avoid use at work. - Continued pantoprazole  for gastric protection while on diclofenac . - Continued prescribed shoulder exercises; transition to maintenance phase once pain resolves. - Provided work note allowing return to work with restriction of no lifting over thirty pounds for one month; after one month, may return to full duty without restrictions. - Scheduled follow-up in four weeks to reassess shoulder function and readiness for full duty. - Instructed to contact clinic if pain worsens or if unable to perform work duties due to shoulder pain. - Provided instructions and documentation in English and Spanish for employer and personal reference.

## 2024-09-12 ENCOUNTER — Other Ambulatory Visit: Payer: Self-pay | Admitting: Physician Assistant

## 2024-09-12 NOTE — Telephone Encounter (Signed)
 Copied from CRM [id number]. Topic: Clinical - Medication Refill >> Sep 12, 2024  3:09 PM Avram MATSU wrote: Medication: gabapentin  (NEURONTIN ) 300 MG capsule [[id number]]  Has the patient contacted their pharmacy? Yes (Agent: If no, request that the patient contact the pharmacy for the refill. If patient does not wish to contact the pharmacy document the reason why and proceed with request.) (Agent: If yes, when and what did the pharmacy advise?)  This is the patient's preferred pharmacy:    [facility] [address] [place] - [address] [place] [address] Phone: [phone] Fax: [phone]  Is this the correct pharmacy for this prescription? No If no, delete pharmacy and type the correct one.   Has the prescription been filled recently? No  Is the patient out of the medication? Yes  Has the patient been seen for an appointment in the last year OR does the patient have an upcoming appointment? Yes  Can we respond through MyChart? Yes  Agent: Please be advised that Rx refills may take up to 3 business days. We ask that you follow-up with your pharmacy.   ----------------------------------------------------------------------- From previous Reason for Contact - Red Word Triage: Red Word that prompted transfer to Nurse Triage:

## 2024-09-15 NOTE — Telephone Encounter (Signed)
 Please review.  KP

## 2024-09-26 ENCOUNTER — Ambulatory Visit: Payer: Self-pay | Admitting: Family Medicine

## 2024-10-03 ENCOUNTER — Encounter: Payer: Self-pay | Admitting: Family Medicine

## 2024-10-03 ENCOUNTER — Ambulatory Visit: Payer: Self-pay | Admitting: Family Medicine

## 2024-10-03 ENCOUNTER — Other Ambulatory Visit: Payer: Self-pay | Admitting: Radiology

## 2024-10-03 VITALS — HR 58 | Ht 65.0 in | Wt 161.0 lb

## 2024-10-03 DIAGNOSIS — M7582 Other shoulder lesions, left shoulder: Secondary | ICD-10-CM

## 2024-10-03 DIAGNOSIS — M62838 Other muscle spasm: Secondary | ICD-10-CM

## 2024-10-03 MED ORDER — METHOCARBAMOL 500 MG PO TABS: 500.0000 mg | ORAL_TABLET | Freq: Three times a day (TID) | ORAL | 0 refills | Status: AC | PRN

## 2024-10-03 MED ORDER — DICLOFENAC SODIUM 75 MG PO TBEC: 75.0000 mg | DELAYED_RELEASE_TABLET | Freq: Two times a day (BID) | ORAL | 0 refills | Status: AC

## 2024-10-03 MED ORDER — METHYLPREDNISOLONE ACETATE 40 MG/ML IJ SUSP
40.0000 mg | Freq: Once | INTRAMUSCULAR | Status: AC
  Administered 2024-10-03: 40 mg via INTRAMUSCULAR

## 2024-10-03 NOTE — Patient Instructions (Addendum)
" °  VISIT SUMMARY: Today, we addressed your persistent left shoulder pain and functional limitations due to rotator cuff tendinitis. We administered a corticosteroid injection, prescribed medications, and discussed further treatment options including physical therapy and potential workers' compensation coverage.  YOUR PLAN: LEFT ROTATOR CUFF TENDINITIS: You have chronic tendinitis in your left shoulder with pain, functional limitations, and possible small tear. -You received an ultrasound-guided corticosteroid injection in your left shoulder. -You were prescribed diclofenac  and methocarbamol . Please pick up these medications from [facility] pharmacy and contact our office if you encounter any issues. -A referral for formal physical therapy has been placed. You will be contacted to schedule your sessions. -Continue your home exercise program for your shoulder, but hold off on exercises for today and tomorrow. Resume next week. -You have a work note restricting lifting to no more than 5 pounds for January 24th. After that, resume prior restrictions (no more than 30 pounds lifting, limit rotation/stirring) until medically cleared. -Monitor your symptoms for improvement over the next 2 weeks post-injection. If there is no improvement or if symptoms worsen, further evaluation including an MRI may be considered. -Persistent pain and weakness despite medications, injection, and therapy may necessitate an MRI and possible surgical referral.  FOLLOW-UP: We need to reassess your symptoms and function. -Your follow-up appointment is scheduled for 8 weeks from now on March 20th.    Contains text generated by [employer].   "

## 2024-10-03 NOTE — Assessment & Plan Note (Signed)
" °  Orders:   US  LIMITED JOINT SPACE STRUCTURES UP LEFT; Future   diclofenac  (VOLTAREN ) 75 MG EC tablet; Take 1 tablet (75 mg total) by mouth 2 (two) times daily.   methocarbamol  (ROBAXIN ) 500 MG tablet; Take 1 tablet (500 mg total) by mouth every 8 (eight) hours as needed.   Ambulatory referral to Physical Therapy  "

## 2024-10-03 NOTE — Addendum Note (Signed)
 Addended by: Aylen Rambert on: 10/03/2024 02:17 PM   Modules accepted: Orders

## 2024-10-03 NOTE — Progress Notes (Signed)
 "    Primary Care / Sports Medicine Office Visit  Patient Information:  Patient ID: Brittney Hampton, female DOB: [birth year] Age: 59 y.o. MRN: [id number]   Brittney Hampton is a pleasant 59 y.o. female presenting with the following:  Chief Complaint  Patient presents with   Shoulder Pain    Patient continues to have left shoulder pain. Mostly discomfort during the day but at night it is pain.     Vitals:   10/03/24 1049  Pulse: (!) 58  SpO2: 96%   Vitals:   10/03/24 1049  Weight: 161 lb (73 kg)  Height: 5' 5 (1.651 m)   Body mass index is 26.79 kg/m.  No results found.   Independent interpretation of notes and tests performed by another provider:   None  Procedures performed:   Procedure:  Injection of left shoulder under ultrasound guidance. Ultrasound guidance in-plan approach to subacromial space Samsung HS60 device utilized with permanent recording / reporting. Verbal informed consent obtained and verified. Skin prepped in a sterile fashion. Ethyl chloride for topical local analgesia.  Completed without difficulty and tolerated well. Medication: triamcinolone  acetonide 40 mg/mL suspension for injection 1 mL total and 2 mL lidocaine  1% without epinephrine utilized for needle placement anesthetic Advised to contact for fevers/chills, erythema, induration, drainage, or persistent bleeding.   Pertinent History, Exam, Impression, and Recommendations:   Discussed the use of AI scribe software for clinical note transcription with the patient, who gave verbal consent to proceed.  History of Present Illness   Brittney Hampton is a 59 year old female with left rotator cuff tendinitis who presents for follow-up of persistent left shoulder pain and functional limitations after a work-related injury.  Left Shoulder Pain and Functional Impairment: - Persistent left shoulder pain since at least August 29, 2024, following a work-related injury - Pain localized to  the left shoulder - Exacerbated by lifting objects heavier than 15 pounds and repetitive activities such as rotation and stirring while cooking - Pain intensifies after her workday, resulting in discomfort and prolonged insomnia - Able to complete workday but develops pain and numbness in fingers, particularly in cold weather - Intermittent paresthesias, including tingling and 'pins and needles' sensations in fingers, associated with shoulder pain - Continues to experience pain with activities despite following work restrictions (no lifting greater than 30 pounds, limiting rotation/stirring with left arm)  Prior Treatments and Barriers to Care: - No medications taken for shoulder pain since last visit due to pharmacy issues (confusion between [facility] and [facility], delays in electronic prescription processing) - Previously prescribed gabapentin  by other providers, but not specifically for current shoulder symptoms - Performed home exercises as instructed, but difficulty continuing due to inadequate pain control - No new injections or treatments since August 29, 2024 - Uncertain about prior cortisone injection indication (administered by another provider) - Has not participated in formal physical therapy due to lack of insurance and high out-of-pocket costs - Concerned about financial burden of therapy and considering options for workplace injury coverage     Physical Exam  LEFT SHOULDER INSPECTION: No deformity, swelling, erythema, or atrophy. Normal shoulder alignment. PALPATION: Subacromial space and bicipital groove non-tender. No crepitus, warmth, effusion, nodules, or bony abnormalities. RANGE OF MOTION: Active and passive range of motion preserved with pain at end-range elevation. STRENGTH: Isolated supraspinatus testing 4/5 strength with significant pain. External and internal rotation 5/5 strength, painless. NEUROLOGICAL: Sensation intact to light touch in the left upper extremity;  no focal motor deficit.  SPECIAL TESTS: Negative Neers test. Positive Hawkins test. Speeds test equivocal. Negative Yergasons test.  RIGHT SHOULDER STRENGTH: Isolated supraspinatus testing 5/5 strength, painless. External and internal rotation 5/5 strength, painless.  CERVICAL SPINE SPECIAL TESTS: Spurlings test negative.  Assessment and Plan    Left rotator cuff tendinitis Chronic tendinitis with pain, functional limitation, and weakness, suggesting possible small tear. Conservative management appropriate; surgical intervention for suboptimal response to conservative measures. - Administered ultrasound-guided corticosteroid injection to left shoulder targeting supraspinatus tendon sheath. - Prescribed diclofenac  and methocarbamol ; sent prescriptions to [facility] pharmacy. - Provided instructions to contact office if unable to obtain medications. - Placed referral for formal physical therapy for left shoulder; advised she will be contacted to schedule. - Advised to continue home exercise program for shoulder, holding exercises for today and tomorrow post-injection, then resume next week. - Issued work note restricting lifting to no more than 5 pounds for 1/24, then resume prior restrictions (no more than 30 pounds lifting, limit rotation/stirring) until medically cleared. - Recommended contacting workplace injury attorney to explore workers' compensation coverage for therapy and treatment costs. - Scheduled follow-up in 8 weeks (March 20th) to reassess symptoms and function. - Instructed to monitor for improvement over next 2 weeks post-injection; if no improvement or worsening symptoms, further evaluation including MRI may be considered. - Provided anticipatory guidance that persistent pain and weakness despite medications, injection, and therapy may necessitate MRI and possible surgical referral.        Assessment & Plan Tendinitis of left rotator cuff  Orders:   US  LIMITED JOINT  SPACE STRUCTURES UP LEFT; Future   diclofenac  (VOLTAREN ) 75 MG EC tablet; Take 1 tablet (75 mg total) by mouth 2 (two) times daily.   methocarbamol  (ROBAXIN ) 500 MG tablet; Take 1 tablet (500 mg total) by mouth every 8 (eight) hours as needed.   Ambulatory referral to Physical Therapy  Cervical paraspinal muscle spasm  Orders:   Ambulatory referral to Physical Therapy   No follow-ups on file.     Selinda JINNY Ku, MD, [facility]   Primary Care Sports Medicine Primary Care and Sports Medicine at [facility]   "

## 2024-11-28 ENCOUNTER — Ambulatory Visit: Payer: Self-pay | Admitting: Family Medicine
